# Patient Record
Sex: Female | Born: 1940
Health system: Southern US, Community
[De-identification: ages and names within clinical notes are randomized; demographics above are authoritative.]

## PROBLEM LIST (undated history)

## (undated) DIAGNOSIS — R011 Cardiac murmur, unspecified: Secondary | ICD-10-CM

## (undated) DIAGNOSIS — K635 Polyp of colon: Secondary | ICD-10-CM

## (undated) DIAGNOSIS — J449 Chronic obstructive pulmonary disease, unspecified: Secondary | ICD-10-CM

## (undated) DIAGNOSIS — D649 Anemia, unspecified: Secondary | ICD-10-CM

## (undated) DIAGNOSIS — C801 Malignant (primary) neoplasm, unspecified: Secondary | ICD-10-CM

## (undated) DIAGNOSIS — K219 Gastro-esophageal reflux disease without esophagitis: Secondary | ICD-10-CM

## (undated) DIAGNOSIS — I499 Cardiac arrhythmia, unspecified: Secondary | ICD-10-CM

## (undated) DIAGNOSIS — R0600 Dyspnea, unspecified: Secondary | ICD-10-CM

## (undated) DIAGNOSIS — N3090 Cystitis, unspecified without hematuria: Secondary | ICD-10-CM

## (undated) DIAGNOSIS — E785 Hyperlipidemia, unspecified: Secondary | ICD-10-CM

## (undated) DIAGNOSIS — J189 Pneumonia, unspecified organism: Secondary | ICD-10-CM

## (undated) HISTORY — DX: Malignant (primary) neoplasm, unspecified: C80.1

## (undated) HISTORY — DX: Hyperlipidemia, unspecified: E78.5

## (undated) HISTORY — PX: CATARACT EXTRACTION, BILATERAL: SHX1313

## (undated) HISTORY — DX: Cystitis, unspecified without hematuria: N30.90

## (undated) HISTORY — DX: Polyp of colon: K63.5

## (undated) HISTORY — DX: Gastro-esophageal reflux disease without esophagitis: K21.9

## (undated) HISTORY — PX: ABDOMINAL HYSTERECTOMY: SHX81

---

## 2003-08-10 ENCOUNTER — Other Ambulatory Visit: Payer: Self-pay

## 2005-01-07 ENCOUNTER — Ambulatory Visit: Payer: Self-pay | Admitting: General Practice

## 2005-01-28 ENCOUNTER — Ambulatory Visit: Payer: Self-pay | Admitting: Internal Medicine

## 2005-03-26 ENCOUNTER — Ambulatory Visit: Payer: Self-pay | Admitting: Endocrinology

## 2005-05-22 ENCOUNTER — Ambulatory Visit: Payer: Self-pay | Admitting: Gastroenterology

## 2005-09-04 ENCOUNTER — Ambulatory Visit: Payer: Self-pay | Admitting: Gastroenterology

## 2006-02-09 ENCOUNTER — Ambulatory Visit: Payer: Self-pay | Admitting: General Practice

## 2008-10-08 ENCOUNTER — Ambulatory Visit: Payer: Self-pay | Admitting: Internal Medicine

## 2008-10-15 ENCOUNTER — Ambulatory Visit: Payer: Self-pay | Admitting: Internal Medicine

## 2009-10-10 ENCOUNTER — Ambulatory Visit: Payer: Self-pay | Admitting: Internal Medicine

## 2009-10-11 ENCOUNTER — Ambulatory Visit: Payer: Self-pay | Admitting: Internal Medicine

## 2010-04-14 ENCOUNTER — Ambulatory Visit: Payer: Self-pay | Admitting: Surgery

## 2010-08-17 DIAGNOSIS — K635 Polyp of colon: Secondary | ICD-10-CM

## 2010-08-17 HISTORY — PX: COLONOSCOPY: SHX174

## 2010-08-17 HISTORY — DX: Polyp of colon: K63.5

## 2010-09-08 ENCOUNTER — Ambulatory Visit: Payer: Self-pay | Admitting: Gastroenterology

## 2010-09-11 LAB — PATHOLOGY REPORT

## 2010-10-23 ENCOUNTER — Ambulatory Visit: Payer: Self-pay | Admitting: Internal Medicine

## 2011-04-17 ENCOUNTER — Emergency Department: Payer: Self-pay | Admitting: Emergency Medicine

## 2011-05-07 ENCOUNTER — Ambulatory Visit: Payer: Self-pay | Admitting: Internal Medicine

## 2011-06-28 ENCOUNTER — Emergency Department: Payer: Self-pay | Admitting: Emergency Medicine

## 2011-07-01 ENCOUNTER — Ambulatory Visit: Payer: Self-pay | Admitting: Specialist

## 2011-07-03 ENCOUNTER — Ambulatory Visit: Payer: Self-pay | Admitting: Specialist

## 2011-08-18 DIAGNOSIS — N3090 Cystitis, unspecified without hematuria: Secondary | ICD-10-CM

## 2011-08-18 HISTORY — PX: FRACTURE SURGERY: SHX138

## 2011-08-18 HISTORY — DX: Cystitis, unspecified without hematuria: N30.90

## 2011-11-27 ENCOUNTER — Ambulatory Visit: Payer: Self-pay | Admitting: Internal Medicine

## 2011-12-03 ENCOUNTER — Ambulatory Visit: Payer: Self-pay | Admitting: Internal Medicine

## 2012-06-16 ENCOUNTER — Ambulatory Visit: Payer: Self-pay | Admitting: Internal Medicine

## 2013-01-05 ENCOUNTER — Ambulatory Visit: Payer: Self-pay | Admitting: Internal Medicine

## 2013-07-25 ENCOUNTER — Ambulatory Visit: Payer: Self-pay | Admitting: Internal Medicine

## 2013-07-31 ENCOUNTER — Other Ambulatory Visit: Payer: Medicare Other

## 2013-07-31 ENCOUNTER — Ambulatory Visit (INDEPENDENT_AMBULATORY_CARE_PROVIDER_SITE_OTHER): Payer: Medicare Other | Admitting: General Surgery

## 2013-07-31 ENCOUNTER — Encounter: Payer: Self-pay | Admitting: General Surgery

## 2013-07-31 VITALS — BP 140/76 | Ht 65.0 in | Wt 203.0 lb

## 2013-07-31 DIAGNOSIS — R928 Other abnormal and inconclusive findings on diagnostic imaging of breast: Secondary | ICD-10-CM

## 2013-07-31 DIAGNOSIS — N63 Unspecified lump in unspecified breast: Secondary | ICD-10-CM

## 2013-07-31 HISTORY — PX: BREAST SURGERY: SHX581

## 2013-07-31 NOTE — Progress Notes (Signed)
Patient ID: Renee Meyer, female   DOB: 04/13/1941, 72 y.o.   MRN: [id number]  Chief Complaint  Patient presents with  . Follow-up    mammogram    HPI Renee Meyer is a 72 y.o. female who presents for a breast evaluation. The most recent mammogram and right breast ultrasound was done on 07/25/13 Patient does perform regular self breast checks and gets regular mammograms done. Patient states in 2013 she had a right breast aspiration .    The patient underwent ultrasound and cyst aspiration in December 2013. HPI  Past Medical History  Diagnosis Date  . Colon polyps 2012  . Cancer in her 80s    basal cell carcinoma  . Cystitis 2013  . Hyperlipidemia   . GERD (gastroesophageal reflux disease)     Past Surgical History  Procedure Laterality Date  . Colonoscopy  2012    Dr Mechele Collin  . Abdominal hysterectomy    . Cesarean section      Family History  Problem Relation Age of Onset  . Colon cancer Sister     Social History History  Substance Use Topics  . Smoking status: Former Games developer  . Smokeless tobacco: Never Used  . Alcohol Use: No    Allergies  Allergen Reactions  . Amoxicillin Hives  . Bisoprolol Hives  . Clindamycin/Lincomycin Hives  . Codeine     headache  . Imdur [Isosorbide Dinitrate]   . Penicillins Hives  . Sulfa Antibiotics Hives  . Zocor [Simvastatin] Swelling    Current Outpatient Prescriptions  Medication Sig Dispense Refill  . ADVAIR DISKUS 250-50 MCG/DOSE AEPB       . DEXILANT 60 MG capsule Take 1 capsule by mouth daily.      Marland Kitchen galantamine (RAZADYNE) 4 MG tablet Take 1 tablet by mouth daily.      . meclizine (ANTIVERT) 25 MG tablet Take 1 tablet by mouth daily.      . metoprolol succinate (TOPROL-XL) 50 MG 24 hr tablet Take 1 tablet by mouth daily.      . pravastatin (PRAVACHOL) 40 MG tablet Take 1 tablet by mouth daily.       No current facility-administered medications for this visit.    Review of Systems Review of Systems   Constitutional: Negative.   Respiratory: Negative.   Cardiovascular: Negative.     Blood pressure 140/76, height 5\' 5"  (1.651 m), weight 203 lb (92.08 kg).  Physical Exam Physical Exam  Constitutional: She is oriented to person, place, and time. She appears well-developed and well-nourished.  Eyes: No scleral icterus.  Cardiovascular: Normal rate, regular rhythm and normal heart sounds.   Pulmonary/Chest: Breath sounds normal. Right breast exhibits no inverted nipple, no mass, no nipple discharge, no skin change and no tenderness. Left breast exhibits no inverted nipple, no mass, no nipple discharge, no skin change and no tenderness.  Abdominal: Soft. Normal appearance and bowel sounds are normal. There is no hepatomegaly. There is no tenderness. No hernia.  Lymphadenopathy:    She has no cervical adenopathy.    She has no axillary adenopathy.  Neurological: She is alert and oriented to person, place, and time.  Skin: Skin is warm and dry.    Data Reviewed Right breast mammogram and ultrasound dated 07/25/2013 showed a stable focal asymmetry in the right breast unchanged from 2014, slightly more prominent compared the past exams.  Ultrasound showed multiple small cysts measuring up to 9 mm. There is felt to correspond to the mammographic abnormality.  Ultrasound examination  of the right breast in the 9:00 position 5 cm from the nipple showed findings essentially unchanged from her 07/21/2012 exam. Multiple small hypoechoic nodules were identified and an area measuring 1.35 x 1.35 x 1.3 cm. No significant vascular flow was identified. He was in the same year that cyst aspiration was completed last December.  The patient was amenable to a vacuum biopsy. 10 cc of 0.5% Xylocaine with 0.25% Marcaine was utilized and well tolerated. A 10-gauge Encor device was used and approximately 10 samples were obtained. All of the obvious cystic lesions were encompassed in the biopsy. Less than 2 cc of  bleeding were noted. This was controlled with direct pressure. A postbiopsy clip was placed. Skin defect was closed with benzoin and Steri-Strips followed by Telfa and Tegaderm dressing. The patient tolerated procedure well postbiopsy instructions were provided.  Assessment    Stable density right breast, ongoing radiology concern.    Plan    The patient will be contacted when her biopsy results are returned. Postbiopsy instructions have been provided. Assuming benign results, she will follow up with the nurse for wound evaluation in one week.       Earline Mayotte 08/01/2013, 1:39 PM

## 2013-07-31 NOTE — Patient Instructions (Signed)

## 2013-08-01 DIAGNOSIS — R928 Other abnormal and inconclusive findings on diagnostic imaging of breast: Secondary | ICD-10-CM | POA: Insufficient documentation

## 2013-08-02 LAB — PATHOLOGY

## 2013-08-03 ENCOUNTER — Telehealth: Payer: Self-pay | Admitting: General Surgery

## 2013-08-03 ENCOUNTER — Telehealth: Payer: Self-pay | Admitting: *Deleted

## 2013-08-03 NOTE — Telephone Encounter (Signed)
The patient returned by called yesterday to get her pathology report. She was advised only benign findings were identified. Multiple tiny cyst and dilated duct. She reports minimal discomfort since the procedure was completed.  She will return next week for wound check with the nurse and arrangements were made for a followup mammogram of the stent 6 months to establish a new baseline.

## 2013-08-03 NOTE — Telephone Encounter (Signed)
Notified patient as instructed, patient pleased. Discussed follow-up appointments, patient agrees  

## 2013-08-03 NOTE — Telephone Encounter (Signed)
Message copied by Currie Paris on Thu Aug 03, 2013  9:43 AM ------      Message from: [place], [place] W      Created: Thu Aug 03, 2013  7:34 AM       I have tried to isolate the patient knows that her biopsy completed on Monday was benign. Please give her another try. ------

## 2013-08-07 ENCOUNTER — Ambulatory Visit (INDEPENDENT_AMBULATORY_CARE_PROVIDER_SITE_OTHER): Payer: Medicare Other | Admitting: *Deleted

## 2013-08-07 DIAGNOSIS — N63 Unspecified lump in unspecified breast: Secondary | ICD-10-CM

## 2013-08-07 NOTE — Progress Notes (Signed)
Patient here today for follow up post right  breast biopsy.  Dressing removed, steristrip in place and aware it may come off in one week.  Minimal bruising noted.  The patient is aware that a heating pad may be used for comfort as needed.  Aware of pathology. Follow up as scheduled. 

## 2014-02-12 ENCOUNTER — Encounter: Payer: Self-pay | Admitting: General Surgery

## 2014-02-19 ENCOUNTER — Encounter: Payer: Self-pay | Admitting: General Surgery

## 2014-02-19 ENCOUNTER — Ambulatory Visit (INDEPENDENT_AMBULATORY_CARE_PROVIDER_SITE_OTHER): Payer: Medicare Other | Admitting: General Surgery

## 2014-02-19 ENCOUNTER — Ambulatory Visit: Payer: Medicare Other | Admitting: General Surgery

## 2014-02-19 VITALS — BP 140/78 | HR 76 | Resp 14 | Ht 66.0 in | Wt 219.0 lb

## 2014-02-19 DIAGNOSIS — R928 Other abnormal and inconclusive findings on diagnostic imaging of breast: Secondary | ICD-10-CM

## 2014-02-19 NOTE — Progress Notes (Signed)
Patient ID: Renee Meyer, female   DOB: July 14, 1941, 73 y.o.   MRN: [id number]  Chief Complaint  Patient presents with  . Follow-up    mammogram    HPI [name] is a 73 y.o. female who presents for a breast evaluation. The most recent mammogram was done on 02/09/14 Patient does perform regular self breast checks and gets regular mammograms done.    HPI  Past Medical History  Diagnosis Date  . Colon polyps 2012  . Cancer in her 73s    basal cell carcinoma  . Cystitis 2013  . Hyperlipidemia   . GERD (gastroesophageal reflux disease)     Past Surgical History  Procedure Laterality Date  . Colonoscopy  2012    Dr Vira Agar  . Abdominal hysterectomy    . Cesarean section    . 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    Family History  Problem Relation Age of Onset  . Colon cancer Sister     Social History History  Substance Use Topics  . Smoking status: Former Research scientist (life sciences)  . Smokeless tobacco: Never Used  . Alcohol Use: No    Allergies  Allergen Reactions  . Amoxicillin Hives  . Bisoprolol Hives  . Clindamycin/Lincomycin Hives  . Codeine     headache  . Imdur [Isosorbide Dinitrate]   . Penicillins Hives  . Sulfa Antibiotics Hives  . Zocor [Simvastatin] Swelling    Current Outpatient Prescriptions  Medication Sig Dispense Refill  . ADVAIR DISKUS 250-50 MCG/DOSE AEPB       . DEXILANT 60 MG capsule Take 1 capsule by mouth daily.      Marland Kitchen galantamine (RAZADYNE) 4 MG tablet Take 1 tablet by mouth daily.      . meclizine (ANTIVERT) 25 MG tablet Take 1 tablet by mouth daily.      . metoprolol succinate (TOPROL-XL) 50 MG 24 hr tablet Take 1 tablet by mouth daily.      . pravastatin (PRAVACHOL) 40 MG tablet Take 1 tablet by mouth daily.       No current facility-administered medications for this visit.    Review of Systems Review of Systems  Constitutional: Negative.   Respiratory: Negative.   Cardiovascular: Positive  for leg swelling. Negative for chest pain and palpitations.    Blood pressure 140/78, pulse 76, resp. rate 14, height 5\' 6"  (1.676 m), weight 219 lb (99.338 kg).  Physical Exam Physical Exam  Constitutional: She is oriented to person, place, and time. She appears well-developed and well-nourished.  Eyes: Conjunctivae are normal. No scleral icterus.  Neck: Neck supple.  Cardiovascular: Normal rate and regular rhythm.   Murmur heard.  Systolic murmur is present with a grade of 1/6  Pulmonary/Chest: Effort normal and breath sounds normal. Right breast exhibits no inverted nipple, no mass, no nipple discharge, no skin change and no tenderness. Left breast exhibits no inverted nipple, no mass, no nipple discharge, no skin change and no tenderness.  Right breast well healed biopsy site.  Abdominal: Soft. Bowel sounds are normal. There is no tenderness.  Musculoskeletal:       Legs: Lymphadenopathy:    She has no cervical adenopathy.    She has no axillary adenopathy.  Neurological: She is alert and oriented to person, place, and time.  Skin: Skin is warm and dry.    Data Reviewed Bilateral mammograms dated February 09, 2014 completed [facility] were reviewed and compared to pre-biopsy studies.  Previous area of architectural distortion of the right breast has been removed. BI-RAD-2.  Assessment    Benign breast exam. Stable mammograms.  Increasing swelling in the gaiter distribution of the lower extremities bilaterally.  Stable cardiopulmonary exam.  Weight gain.      Plan    There is no evidence of congestive failure on today's exam. The bilateral lower extremity edema may be secondary to venous incompetence. She was encouraged to contact her PCP for reassessment of her cardiopulmonary status.      PCP: Fanny Skates 02/19/2014, 10:08 PM

## 2014-02-19 NOTE — Patient Instructions (Signed)
Patient to return as needed. 

## 2014-04-26 ENCOUNTER — Emergency Department: Payer: Self-pay | Admitting: Emergency Medicine

## 2014-04-26 LAB — BASIC METABOLIC PANEL
Anion Gap: 5 — ABNORMAL LOW (ref 7–16)
BUN: 19 mg/dL — AB (ref 7–18)
CO2: 27 mmol/L (ref 21–32)
Calcium, Total: 9.1 mg/dL (ref 8.5–10.1)
Chloride: 110 mmol/L — ABNORMAL HIGH (ref 98–107)
Creatinine: 0.89 mg/dL (ref 0.60–1.30)
EGFR (Non-African Amer.): >60
Glucose: 96 mg/dL (ref 65–99)
Osmolality: 285 (ref 275–301)
POTASSIUM: 4.1 mmol/L (ref 3.5–5.1)
Sodium: 142 mmol/L (ref 136–145)

## 2014-04-26 LAB — D-DIMER(ARMC): D-DIMER: 749 ng/mL

## 2014-04-26 LAB — PRO B NATRIURETIC PEPTIDE: B-Type Natriuretic Peptide: 79 pg/mL (ref 0–125)

## 2014-04-26 LAB — CBC
HCT: 37.3 % (ref 35.0–47.0)
HGB: 12 g/dL (ref 12.0–16.0)
MCH: 29.8 pg (ref 26.0–34.0)
MCHC: 32.1 g/dL (ref 32.0–36.0)
MCV: 93 fL (ref 80–100)
Platelet: 214 10*3/uL (ref 150–440)
RBC: 4.02 10*6/uL (ref 3.80–5.20)
RDW: 13.5 % (ref 11.5–14.5)
WBC: 7.9 10*3/uL (ref 3.6–11.0)

## 2014-04-26 LAB — TSH: Thyroid Stimulating Horm: 1.36 u[IU]/mL

## 2014-04-26 LAB — TROPONIN I: Troponin-I: <0.02 ng/mL

## 2014-05-22 DIAGNOSIS — K219 Gastro-esophageal reflux disease without esophagitis: Secondary | ICD-10-CM | POA: Insufficient documentation

## 2014-05-22 DIAGNOSIS — E782 Mixed hyperlipidemia: Secondary | ICD-10-CM | POA: Insufficient documentation

## 2014-11-26 DIAGNOSIS — I493 Ventricular premature depolarization: Secondary | ICD-10-CM | POA: Insufficient documentation

## 2014-12-31 ENCOUNTER — Other Ambulatory Visit: Payer: Self-pay | Admitting: Orthopedic Surgery

## 2014-12-31 DIAGNOSIS — M65831 Other synovitis and tenosynovitis, right forearm: Secondary | ICD-10-CM

## 2014-12-31 DIAGNOSIS — M1811 Unilateral primary osteoarthritis of first carpometacarpal joint, right hand: Secondary | ICD-10-CM

## 2014-12-31 DIAGNOSIS — M25531 Pain in right wrist: Secondary | ICD-10-CM

## 2015-01-04 ENCOUNTER — Ambulatory Visit
Admission: RE | Admit: 2015-01-04 | Discharge: 2015-01-04 | Disposition: A | Discharge status: Home / Self Care | Payer: Medicare Other | Source: Ambulatory Visit | Attending: Orthopedic Surgery | Admitting: Orthopedic Surgery

## 2015-01-04 DIAGNOSIS — S62114A Nondisplaced fracture of triquetrum [cuneiform] bone, right wrist, initial encounter for closed fracture: Secondary | ICD-10-CM | POA: Diagnosis not present

## 2015-01-04 DIAGNOSIS — M79641 Pain in right hand: Secondary | ICD-10-CM | POA: Diagnosis present

## 2015-01-04 DIAGNOSIS — M65831 Other synovitis and tenosynovitis, right forearm: Secondary | ICD-10-CM

## 2015-01-04 DIAGNOSIS — R609 Edema, unspecified: Secondary | ICD-10-CM | POA: Insufficient documentation

## 2015-01-04 DIAGNOSIS — M79644 Pain in right finger(s): Secondary | ICD-10-CM | POA: Diagnosis present

## 2015-01-04 DIAGNOSIS — S63591A Other specified sprain of right wrist, initial encounter: Secondary | ICD-10-CM | POA: Insufficient documentation

## 2015-01-04 DIAGNOSIS — M1811 Unilateral primary osteoarthritis of first carpometacarpal joint, right hand: Secondary | ICD-10-CM

## 2015-01-04 DIAGNOSIS — M25531 Pain in right wrist: Secondary | ICD-10-CM

## 2015-09-26 DIAGNOSIS — R300 Dysuria: Secondary | ICD-10-CM | POA: Diagnosis not present

## 2015-11-19 DIAGNOSIS — J40 Bronchitis, not specified as acute or chronic: Secondary | ICD-10-CM | POA: Diagnosis not present

## 2015-11-19 DIAGNOSIS — J4522 Mild intermittent asthma with status asthmaticus: Secondary | ICD-10-CM | POA: Diagnosis not present

## 2015-11-28 DIAGNOSIS — Z79899 Other long term (current) drug therapy: Secondary | ICD-10-CM | POA: Diagnosis not present

## 2015-11-28 DIAGNOSIS — E782 Mixed hyperlipidemia: Secondary | ICD-10-CM | POA: Diagnosis not present

## 2015-12-05 DIAGNOSIS — J449 Chronic obstructive pulmonary disease, unspecified: Secondary | ICD-10-CM | POA: Insufficient documentation

## 2015-12-05 DIAGNOSIS — Z0000 Encounter for general adult medical examination without abnormal findings: Secondary | ICD-10-CM | POA: Diagnosis not present

## 2015-12-05 DIAGNOSIS — E782 Mixed hyperlipidemia: Secondary | ICD-10-CM | POA: Diagnosis not present

## 2016-01-13 DIAGNOSIS — J069 Acute upper respiratory infection, unspecified: Secondary | ICD-10-CM | POA: Diagnosis not present

## 2016-02-27 DIAGNOSIS — Z1231 Encounter for screening mammogram for malignant neoplasm of breast: Secondary | ICD-10-CM | POA: Diagnosis not present

## 2016-03-17 DIAGNOSIS — K5792 Diverticulitis of intestine, part unspecified, without perforation or abscess without bleeding: Secondary | ICD-10-CM | POA: Diagnosis not present

## 2016-05-18 DIAGNOSIS — R300 Dysuria: Secondary | ICD-10-CM | POA: Diagnosis not present

## 2016-05-19 DIAGNOSIS — J209 Acute bronchitis, unspecified: Secondary | ICD-10-CM | POA: Diagnosis not present

## 2016-06-03 DIAGNOSIS — Z0000 Encounter for general adult medical examination without abnormal findings: Secondary | ICD-10-CM | POA: Diagnosis not present

## 2016-06-03 DIAGNOSIS — E782 Mixed hyperlipidemia: Secondary | ICD-10-CM | POA: Diagnosis not present

## 2016-06-08 DIAGNOSIS — Z79899 Other long term (current) drug therapy: Secondary | ICD-10-CM | POA: Diagnosis not present

## 2016-06-08 DIAGNOSIS — J449 Chronic obstructive pulmonary disease, unspecified: Secondary | ICD-10-CM | POA: Diagnosis not present

## 2016-07-26 ENCOUNTER — Emergency Department: Payer: PPO

## 2016-07-26 ENCOUNTER — Encounter: Payer: Self-pay | Admitting: Medical Oncology

## 2016-07-26 ENCOUNTER — Emergency Department
Admission: EM | Admit: 2016-07-26 | Discharge: 2016-07-26 | Disposition: A | Discharge status: Home / Self Care | Payer: PPO | Attending: Emergency Medicine | Admitting: Emergency Medicine

## 2016-07-26 DIAGNOSIS — S82851A Displaced trimalleolar fracture of right lower leg, initial encounter for closed fracture: Secondary | ICD-10-CM | POA: Diagnosis not present

## 2016-07-26 DIAGNOSIS — Z87891 Personal history of nicotine dependence: Secondary | ICD-10-CM | POA: Diagnosis not present

## 2016-07-26 DIAGNOSIS — Y999 Unspecified external cause status: Secondary | ICD-10-CM | POA: Insufficient documentation

## 2016-07-26 DIAGNOSIS — Y929 Unspecified place or not applicable: Secondary | ICD-10-CM | POA: Diagnosis not present

## 2016-07-26 DIAGNOSIS — X501XXA Overexertion from prolonged static or awkward postures, initial encounter: Secondary | ICD-10-CM | POA: Insufficient documentation

## 2016-07-26 DIAGNOSIS — Y939 Activity, unspecified: Secondary | ICD-10-CM | POA: Insufficient documentation

## 2016-07-26 DIAGNOSIS — S99911A Unspecified injury of right ankle, initial encounter: Secondary | ICD-10-CM | POA: Diagnosis not present

## 2016-07-26 MED ORDER — OXYCODONE-ACETAMINOPHEN 5-325 MG PO TABS: 1.0000 | ORAL_TABLET | Freq: Four times a day (QID) | ORAL | 0 refills | Status: DC | PRN

## 2016-07-26 MED ORDER — OXYCODONE-ACETAMINOPHEN 5-325 MG PO TABS
1.0000 | ORAL_TABLET | Freq: Once | ORAL | Status: AC
Start: 2016-07-26 — End: 2016-07-26
  Administered 2016-07-26: 1 via ORAL
  Filled 2016-07-26: qty 1

## 2016-07-26 NOTE — ED Triage Notes (Signed)
Pt reports she slipped and fell this am on ice and injured her rt ankle. Pt denies hitting head.

## 2016-07-26 NOTE — ED Provider Notes (Signed)
[facility] Emergency Department Provider Note  ____________________________________________  Time seen: Approximately 3:35 PM  I have reviewed the triage vital signs and the nursing notes.   HISTORY  Chief Complaint Ankle Pain    HPI Renee Meyer is a 75 y.o. female , NAD, presents to the mesh department with right ankle pain and swelling. Patient states she slipped on a wet ramp earlier today and states that her right ankle twisted and went beneath her. Had immediate onset of pain and swelling about the right ankle. Was able to bear weight but with significant pain. Denies head injury, neck pain, back pain. No LOC, visual changes, lightheadedness or dizziness. Has had no pelvic or hip pain. Denies any numbness, weakness, tingling. Does state she has chronic bilateral lower extremity edema in which she takes Lasix. Denies any previous injuries or traumas to the ankle. Denies chest pain, shortness breath, abdominal pain, nausea or vomiting. Does note a superficial abrasion to the medial right ankle but otherwise no open wounds or active bleeding, oozing or weeping.   Past Medical History:  Diagnosis Date  . Cancer ([place]) in her 67s   basal cell carcinoma  . Colon polyps 2012  . Cystitis 2013  . GERD (gastroesophageal reflux disease)   . Hyperlipidemia     Patient Active Problem List   Diagnosis Date Noted  . Abnormal mammogram 08/01/2013    Past Surgical History:  Procedure Laterality Date  . ABDOMINAL HYSTERECTOMY    . 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  Marland Kitchen CESAREAN SECTION    . COLONOSCOPY  2012   Dr Vira Agar    Prior to Admission medications   Medication Sig Start Date End Date Taking? Authorizing Provider  ADVAIR DISKUS 250-50 MCG/DOSE AEPB  06/13/13   Historical Provider, MD  DEXILANT 60 MG capsule Take 1 capsule by mouth daily. 07/14/13   Historical Provider, MD  galantamine (RAZADYNE) 4 MG  tablet Take 1 tablet by mouth daily. 07/14/13   Historical Provider, MD  meclizine (ANTIVERT) 25 MG tablet Take 1 tablet by mouth daily. 07/17/13   Historical Provider, MD  metoprolol succinate (TOPROL-XL) 50 MG 24 hr tablet Take 1 tablet by mouth daily. 07/14/13   Historical Provider, MD  oxyCODONE-acetaminophen (ROXICET) 5-325 MG tablet Take 1 tablet by mouth every 6 (six) hours as needed. 07/26/16 07/26/17  Earlena Werst L Georgios Kina, PA-C  pravastatin (PRAVACHOL) 40 MG tablet Take 1 tablet by mouth daily. 05/15/13   Historical Provider, MD    Allergies Amoxicillin; Bisoprolol; Clindamycin/lincomycin; Codeine; Imdur [isosorbide dinitrate]; Penicillins; Sulfa antibiotics; and Zocor [simvastatin]  Family History  Problem Relation Age of Onset  . Colon cancer Sister     Social History Social History  Substance Use Topics  . Smoking status: Former Research scientist (life sciences)  . Smokeless tobacco: Never Used  . Alcohol use No     Review of Systems  Constitutional: No fatigue Eyes: No visual changes.  Cardiovascular: No chest pain. Respiratory: No shortness of breath.  Gastrointestinal: No abdominal pain.  No nausea, vomiting.   Musculoskeletal: Positive right ankle pain. Negative for back, Neck, pelvic, hip pain.  Skin: Positive swelling right ankle. Positive superficial abrasion right ankle. Negative for rash or pruritus, abnormal warmth, bruising, lacerations. Neurological: Negative for numbness, weakness, tingling. No LOC, dizziness, lightheadedness. 10-point ROS otherwise negative.  ____________________________________________   PHYSICAL EXAM:  VITAL SIGNS: ED Triage Vitals [07/26/16 1436]  Enc Vitals Group     BP (!) 171/84  Pulse Rate 81     Resp 18     Temp 97.5 F (36.4 C)     Temp Source Oral     SpO2 97 %     Weight 200 lb (90.7 kg)     Height 5\' 5"  (1.651 m)     Head Circumference      [facility] Flow      Pain Score 10     Pain Loc      Pain Edu?      Excl. in [place]?      Constitutional:  Alert and oriented. Well appearing and in no acute distress. Eyes: Conjunctivae are normal without icterus, injection or discharge Head: Atraumatic. Neck: Supple with full range of motion. Cardiovascular:  Good peripheral circulation with palpable pulses in the right lower extremity but difficult to assess due to chronic edema and pain. Capillary refill is brisk in all digits of the right foot. Respiratory: Normal respiratory effort without tachypnea or retractions.  Musculoskeletal: Tenderness to palpation about the medial and lateral malleolus of the right ankle. Decreased range of motion of the right ankle due to pain and swelling. Full range of motion of all digits on the right foot. Bilateral lower extremity edema with trace pitting in which the patient states is chronic and unchanged.  No joint effusions. Neurologic:  Normal speech and language. No gross focal neurologic deficits are appreciated. Sensation to light touch grossly intact. The right lower extremity. Skin:  Superficial, nonbleeding abrasion noted about the right medial ankle. Skin is warm, dry. No rash noted. Psychiatric: Mood and affect are normal. Speech and behavior are normal. Patient exhibits appropriate insight and judgement.   ____________________________________________   LABS  None ____________________________________________  EKG  None ____________________________________________  RADIOLOGY I, Braxton Feathers, personally viewed and evaluated these images (plain radiographs) as part of my medical decision making, as well as reviewing the written report by the radiologist.  Dg Ankle Complete Right  Result Date: 07/26/2016 CLINICAL DATA:  Fall this morning with right ankle injury EXAM: RIGHT ANKLE - COMPLETE 3+ VIEW COMPARISON:  None. FINDINGS: Marked diffuse right ankle soft tissue swelling. Oblique right lateral malleolus fracture with minimal 3 mm posterior/lateral displacement of the dominant distal fracture  fragment. Transverse nondisplaced right medial malleolus fracture. Posterior malleolus fracture in the right distal tibia without significant displacement. No subluxation at the right ankle mortise. No suspicious focal osseous lesion. No radiopaque foreign body. IMPRESSION: Minimally displaced trimalleolar right ankle fracture as described. No subluxation. Marked diffuse soft tissue swelling. Electronically Signed   By: Ilona Sorrel M.D.   On: 07/26/2016 15:31    ____________________________________________    PROCEDURES  Procedure(s) performed: None   Procedures   Medications  oxyCODONE-acetaminophen (PERCOCET/ROXICET) 5-325 MG per tablet 1 tablet (1 tablet Oral Given 07/26/16 1601)     ____________________________________________   INITIAL IMPRESSION / ASSESSMENT AND PLAN / ED COURSE  Pertinent labs & imaging results that were available during my care of the patient were reviewed by me and considered in my medical decision making (see chart for details).  Clinical Course as of Jul 27 1643  Sun Jul 26, 2016  1606 I spoke with Dr. Mack Guise, on call physician for orthopedics, in regards to the patient's history, physical exam and imaging results. He suggested the patient be placed in a splint and should be completely nonweightbearing. She should call Dr. Monter Mo office tomorrow morning to schedule an appointment for follow-up.   [JH]    Clinical Course  User Index [JH] Braxton Feathers, PA-C    Patient's diagnosis is consistent with closed trimalleolar fracture of the right ankle. Patient was placed in a short leg stirrup splint and given a walker to use only for transfers from sitting to standing, from the bed to the restroom. Patient verbalizes understanding that she is not to bear weight on the right ankle or foot.  Patient will be discharged home with prescriptions for Roxicet to take as needed, sparingly for severe pain. Patient is to follow up with Dr. Mack Guise in  orthopedics for further evaluation and treatment of fracture. Patient will call Dr. Emrich Mo office tomorrow morning to schedule her appointment. Patient is given ED precautions to return to the ED for any worsening or new symptoms.    ____________________________________________  FINAL CLINICAL IMPRESSION(S) / ED DIAGNOSES  Final diagnoses:  Closed trimalleolar fracture of right ankle, initial encounter      NEW MEDICATIONS STARTED DURING THIS VISIT:  New Prescriptions   OXYCODONE-ACETAMINOPHEN (ROXICET) 5-325 MG TABLET    Take 1 tablet by mouth every 6 (six) hours as needed.         Braxton Feathers, PA-C 07/26/16 1646    Earleen Newport, MD 07/26/16 [phone]

## 2016-07-26 NOTE — Discharge Instructions (Signed)
DO NOT WALK OR WEIGHT BEAR ON THE RIGHT FOOT/ANKLE.  KEEP RIGHT ANKLE ELEVATED TO DECREASE SWELLING

## 2016-07-29 DIAGNOSIS — I10 Essential (primary) hypertension: Secondary | ICD-10-CM | POA: Diagnosis not present

## 2016-07-29 DIAGNOSIS — S82855A Nondisplaced trimalleolar fracture of left lower leg, initial encounter for closed fracture: Secondary | ICD-10-CM | POA: Diagnosis not present

## 2016-07-29 DIAGNOSIS — S82899A Other fracture of unspecified lower leg, initial encounter for closed fracture: Secondary | ICD-10-CM | POA: Diagnosis present

## 2016-07-29 DIAGNOSIS — R262 Difficulty in walking, not elsewhere classified: Secondary | ICD-10-CM | POA: Diagnosis not present

## 2016-07-29 DIAGNOSIS — M6281 Muscle weakness (generalized): Secondary | ICD-10-CM | POA: Diagnosis not present

## 2016-07-29 DIAGNOSIS — Z7401 Bed confinement status: Secondary | ICD-10-CM | POA: Diagnosis not present

## 2016-07-29 DIAGNOSIS — Z5189 Encounter for other specified aftercare: Secondary | ICD-10-CM | POA: Diagnosis not present

## 2016-07-29 DIAGNOSIS — K219 Gastro-esophageal reflux disease without esophagitis: Secondary | ICD-10-CM | POA: Diagnosis not present

## 2016-07-29 DIAGNOSIS — E785 Hyperlipidemia, unspecified: Secondary | ICD-10-CM | POA: Diagnosis not present

## 2016-07-29 DIAGNOSIS — M25571 Pain in right ankle and joints of right foot: Secondary | ICD-10-CM | POA: Diagnosis not present

## 2016-07-29 NOTE — NC FL2 (Signed)
  [place] LEVEL OF CARE SCREENING TOOL     IDENTIFICATION  Patient Name: Renee Meyer Birthdate: 01-Sep-1940 Sex: female Admission Date (Current Location): 07/26/2016  [place] and [place] Number:  Engineering geologist and Address:  [facility], [address] [place], [place] [address]      Provider Number: [id number]  Attending Physician Name and Address:  No att. providers found  Relative Name and Phone Number:       Current Level of Care: Hospital Recommended Level of Care: [place] Prior Approval Number:    Date Approved/Denied:   PASRR Number:  ([id number])  Discharge Plan: SNF    Current Diagnoses: Patient Active Problem List   Diagnosis Date Noted  . Abnormal mammogram 08/01/2013    Orientation RESPIRATION BLADDER Height & Weight     Self, Time, Situation, Place  Normal Continent Weight: 200 lb (90.7 kg) Height:  5\' 5"  (165.1 cm)  BEHAVIORAL SYMPTOMS/MOOD NEUROLOGICAL BOWEL NUTRITION STATUS   (none)  (none) Continent Diet (Regular Diet )  AMBULATORY STATUS COMMUNICATION OF NEEDS Skin   Extensive Assist Verbally Normal                       Personal Care Assistance Level of Assistance  Bathing, Feeding, Dressing Bathing Assistance: Limited assistance Feeding assistance: Independent Dressing Assistance: Limited assistance     Functional Limitations Info  Sight, Hearing, Speech Sight Info: Adequate Hearing Info: Adequate Speech Info: Adequate    SPECIAL CARE FACTORS FREQUENCY  PT (By licensed PT), OT (By licensed OT)     PT Frequency:  (5) OT Frequency:  (5)            Contractures      Additional Factors Info  Code Status, Allergies Code Status Info:  (Full Code. ) Allergies Info:  (Amoxicillin, Bisoprolol, Clindamycin/lincomycin, Codeine, ImdurI sosorbide Dinitrate, Penicillins, Sulfa Antibiotics, Zocor Simvastatin )           Current Medications (07/29/2016):  This  is the current hospital active medication list No current facility-administered medications for this encounter.    Current Outpatient Prescriptions  Medication Sig Dispense Refill  . ADVAIR DISKUS 250-50 MCG/DOSE AEPB     . DEXILANT 60 MG capsule Take 1 capsule by mouth daily.    Marland Kitchen galantamine (RAZADYNE) 4 MG tablet Take 1 tablet by mouth daily.    . meclizine (ANTIVERT) 25 MG tablet Take 1 tablet by mouth daily.    . metoprolol succinate (TOPROL-XL) 50 MG 24 hr tablet Take 1 tablet by mouth daily.    Marland Kitchen oxyCODONE-acetaminophen (ROXICET) 5-325 MG tablet Take 1 tablet by mouth every 6 (six) hours as needed. 12 tablet 0  . pravastatin (PRAVACHOL) 40 MG tablet Take 1 tablet by mouth daily.       Discharge Medications: Please see discharge summary for a list of discharge medications.  Relevant Imaging Results:  Relevant Lab Results:   Additional Information  (SSN: [id number])  Zubayr Bednarczyk, Veronia Beets, LCSW

## 2016-07-29 NOTE — Progress Notes (Signed)
Clinical Education officer, museum (CSW) received a call from Dr. Mack Guise stating that patient was in his office for a follow up appointment for an ankle fracture and needs SNF placement. Patient has Administrator, sports. CSW spoke to patient's niece Claiborne Billings via telephone who accepted bed offer from [facility]. Per [facility] liaison patient can come to [facility] today room 808. Health Team authorization has been received. auth # [id number]. EMS will transport patient from MD office to [facility]. EMS has been called. Please reconsult if future social work needs arise. CSW signing off.   [employer], LCSW [phone]

## 2016-08-02 DIAGNOSIS — S82899A Other fracture of unspecified lower leg, initial encounter for closed fracture: Secondary | ICD-10-CM | POA: Diagnosis not present

## 2016-08-02 DIAGNOSIS — I10 Essential (primary) hypertension: Secondary | ICD-10-CM | POA: Diagnosis not present

## 2016-08-02 DIAGNOSIS — E784 Other hyperlipidemia: Secondary | ICD-10-CM | POA: Diagnosis not present

## 2016-08-02 DIAGNOSIS — K219 Gastro-esophageal reflux disease without esophagitis: Secondary | ICD-10-CM | POA: Diagnosis not present

## 2016-08-04 ENCOUNTER — Other Ambulatory Visit: Payer: Self-pay | Admitting: Orthopedic Surgery

## 2016-08-04 ENCOUNTER — Other Ambulatory Visit: Payer: PPO

## 2016-08-04 DIAGNOSIS — Z01810 Encounter for preprocedural cardiovascular examination: Secondary | ICD-10-CM | POA: Diagnosis not present

## 2016-08-05 ENCOUNTER — Ambulatory Visit
Admission: RE | Admit: 2016-08-05 | Discharge: 2016-08-05 | Disposition: A | Discharge status: Home / Self Care | Payer: PPO | Source: Ambulatory Visit | Attending: Orthopedic Surgery | Admitting: Orthopedic Surgery

## 2016-08-05 ENCOUNTER — Encounter
Admission: RE | Admit: 2016-08-05 | Discharge: 2016-08-05 | Disposition: A | Discharge status: Home / Self Care | Payer: PPO | Source: Ambulatory Visit | Attending: Orthopedic Surgery | Admitting: Orthopedic Surgery

## 2016-08-05 ENCOUNTER — Other Ambulatory Visit: Payer: Self-pay | Admitting: Orthopedic Surgery

## 2016-08-05 DIAGNOSIS — E785 Hyperlipidemia, unspecified: Secondary | ICD-10-CM | POA: Diagnosis not present

## 2016-08-05 DIAGNOSIS — Z881 Allergy status to other antibiotic agents status: Secondary | ICD-10-CM | POA: Diagnosis not present

## 2016-08-05 DIAGNOSIS — Z01810 Encounter for preprocedural cardiovascular examination: Secondary | ICD-10-CM

## 2016-08-05 DIAGNOSIS — Z885 Allergy status to narcotic agent status: Secondary | ICD-10-CM | POA: Diagnosis not present

## 2016-08-05 DIAGNOSIS — J449 Chronic obstructive pulmonary disease, unspecified: Secondary | ICD-10-CM | POA: Insufficient documentation

## 2016-08-05 DIAGNOSIS — Z7401 Bed confinement status: Secondary | ICD-10-CM | POA: Diagnosis not present

## 2016-08-05 DIAGNOSIS — D508 Other iron deficiency anemias: Secondary | ICD-10-CM | POA: Diagnosis not present

## 2016-08-05 DIAGNOSIS — Z01818 Encounter for other preprocedural examination: Secondary | ICD-10-CM | POA: Diagnosis not present

## 2016-08-05 DIAGNOSIS — S82851A Displaced trimalleolar fracture of right lower leg, initial encounter for closed fracture: Secondary | ICD-10-CM | POA: Diagnosis not present

## 2016-08-05 DIAGNOSIS — Z882 Allergy status to sulfonamides status: Secondary | ICD-10-CM | POA: Diagnosis not present

## 2016-08-05 DIAGNOSIS — Z5189 Encounter for other specified aftercare: Secondary | ICD-10-CM | POA: Diagnosis not present

## 2016-08-05 DIAGNOSIS — R262 Difficulty in walking, not elsewhere classified: Secondary | ICD-10-CM | POA: Diagnosis not present

## 2016-08-05 DIAGNOSIS — M25571 Pain in right ankle and joints of right foot: Secondary | ICD-10-CM | POA: Diagnosis not present

## 2016-08-05 DIAGNOSIS — I10 Essential (primary) hypertension: Secondary | ICD-10-CM | POA: Diagnosis not present

## 2016-08-05 DIAGNOSIS — Z888 Allergy status to other drugs, medicaments and biological substances status: Secondary | ICD-10-CM | POA: Diagnosis not present

## 2016-08-05 DIAGNOSIS — S8251XA Displaced fracture of medial malleolus of right tibia, initial encounter for closed fracture: Secondary | ICD-10-CM | POA: Diagnosis not present

## 2016-08-05 DIAGNOSIS — S82855A Nondisplaced trimalleolar fracture of left lower leg, initial encounter for closed fracture: Secondary | ICD-10-CM | POA: Diagnosis not present

## 2016-08-05 DIAGNOSIS — Z87891 Personal history of nicotine dependence: Secondary | ICD-10-CM | POA: Diagnosis not present

## 2016-08-05 DIAGNOSIS — K219 Gastro-esophageal reflux disease without esophagitis: Secondary | ICD-10-CM | POA: Diagnosis not present

## 2016-08-05 DIAGNOSIS — Z90710 Acquired absence of both cervix and uterus: Secondary | ICD-10-CM | POA: Diagnosis not present

## 2016-08-05 DIAGNOSIS — Z01812 Encounter for preprocedural laboratory examination: Secondary | ICD-10-CM

## 2016-08-05 DIAGNOSIS — E876 Hypokalemia: Secondary | ICD-10-CM | POA: Diagnosis not present

## 2016-08-05 DIAGNOSIS — S82851D Displaced trimalleolar fracture of right lower leg, subsequent encounter for closed fracture with routine healing: Secondary | ICD-10-CM | POA: Diagnosis not present

## 2016-08-05 DIAGNOSIS — D649 Anemia, unspecified: Secondary | ICD-10-CM | POA: Diagnosis not present

## 2016-08-05 DIAGNOSIS — Z85828 Personal history of other malignant neoplasm of skin: Secondary | ICD-10-CM | POA: Diagnosis not present

## 2016-08-05 DIAGNOSIS — M6281 Muscle weakness (generalized): Secondary | ICD-10-CM | POA: Diagnosis not present

## 2016-08-05 DIAGNOSIS — Z880 Allergy status to penicillin: Secondary | ICD-10-CM | POA: Diagnosis not present

## 2016-08-05 HISTORY — DX: Cardiac murmur, unspecified: R01.1

## 2016-08-05 HISTORY — DX: Anemia, unspecified: D64.9

## 2016-08-05 HISTORY — DX: Cardiac arrhythmia, unspecified: I49.9

## 2016-08-05 HISTORY — DX: Chronic obstructive pulmonary disease, unspecified: J44.9

## 2016-08-05 LAB — BASIC METABOLIC PANEL
Anion gap: 11 (ref 5–15)
BUN: 17 mg/dL (ref 6–20)
CHLORIDE: 96 mmol/L — AB (ref 101–111)
CO2: 32 mmol/L (ref 22–32)
Calcium: 10.2 mg/dL (ref 8.9–10.3)
Creatinine, Ser: 1.14 mg/dL — ABNORMAL HIGH (ref 0.44–1.00)
GFR calc non Af Amer: 47 mL/min — ABNORMAL LOW (ref >60)
GFR, EST AFRICAN AMERICAN: 54 mL/min — AB (ref >60)
Glucose, Bld: 110 mg/dL — ABNORMAL HIGH (ref 65–99)
POTASSIUM: 4.2 mmol/L (ref 3.5–5.1)
SODIUM: 139 mmol/L (ref 135–145)

## 2016-08-05 LAB — CBC WITH DIFFERENTIAL/PLATELET
Basophils Absolute: 0.1 10*3/uL (ref 0–0.1)
Basophils Relative: 1 %
Eosinophils Absolute: 0.4 10*3/uL (ref 0–0.7)
Eosinophils Relative: 3 %
HEMATOCRIT: 37.6 % (ref 35.0–47.0)
HEMOGLOBIN: 12.3 g/dL (ref 12.0–16.0)
LYMPHS ABS: 1.7 10*3/uL (ref 1.0–3.6)
LYMPHS PCT: 15 %
MCH: 29 pg (ref 26.0–34.0)
MCHC: 32.8 g/dL (ref 32.0–36.0)
MCV: 88.6 fL (ref 80.0–100.0)
MONOS PCT: 9 %
Monocytes Absolute: 1 10*3/uL — ABNORMAL HIGH (ref 0.2–0.9)
NEUTROS PCT: 72 %
Neutro Abs: 8.2 10*3/uL — ABNORMAL HIGH (ref 1.4–6.5)
Platelets: 290 10*3/uL (ref 150–440)
RBC: 4.24 MIL/uL (ref 3.80–5.20)
RDW: 14 % (ref 11.5–14.5)
WBC: 11.3 10*3/uL — AB (ref 3.6–11.0)

## 2016-08-05 LAB — PROTIME-INR
INR: 1.06
Prothrombin Time: 13.8 seconds (ref 11.4–15.2)

## 2016-08-05 LAB — APTT: aPTT: 29 seconds (ref 24–36)

## 2016-08-05 NOTE — Patient Instructions (Signed)
  Your procedure is scheduled on:Dec. 21 , 2017. Report to Same Day Surgery. To find out your arrival time please call [phone] today between 1PM - 3PM .  Remember: Instructions that are not followed completely may result in serious medical risk, up to and including death, or upon the discretion of your surgeon and anesthesiologist your surgery may need to be rescheduled.    _x___ 1. Do not eat food or drink liquids after midnight. No gum chewing or hard candies.     ____ 2. No Alcohol for 24 hours before or after surgery.   ____ 3. Bring all medications with you on the day of surgery if instructed.    __x__ 4. Notify your doctor if there is any change in your medical condition     (cold, fever, infections).    _____ 5. No smoking 24 hours prior to surgery.     Do not wear jewelry, make-up, hairpins, clips or nail polish.  Do not wear lotions, powders, or perfumes.   Do not shave 48 hours prior to surgery. Men may shave face and neck.  Do not bring valuables to the hospital.    [facility] is not responsible for any belongings or valuables.               Contacts, dentures or bridgework may not be worn into surgery.  Leave your suitcase in the car. After surgery it may be brought to your room.  For patients admitted to the hospital, discharge time is determined by your treatment team.   Patients discharged the day of surgery will not be allowed to drive home.    Please read over the following fact sheets that you were given:   [facility] Preparing for Surgery  _x___ Take these medicines the morning of surgery with A SIP OF WATER:    1. metoprolol succinate (TOPROL-XL)     2. omeprazole (PRILOSEC)  3. pravastatin (PRAVACHOL)  4.HYDROcodone-acetaminophen (NORCO/VICODIN) optional   ____ Fleet Enema (as directed)   _x___ Use CHG Soap as directed on instruction sheet  ____ Use inhalers on the day of surgery and bring to hospital day of surgery  ____ Stop metformin 2  days prior to surgery    ____ Take 1/2 of usual insulin dose the night before surgery and none on the morning of surgery.   ____ Stop Coumadin/Plavix/aspirin on does not apply.  _x___ Stop Anti-inflammatories such as Advil, Aleve, Ibuprofen, Motrin, Naproxen, Naprosyn, Goodies powders or aspirin products. OK to take Tylenol or  HYDROcodone-acetaminophen (NORCO/VICODIN) .   ____ Stop supplements until after surgery.    ____ Bring C-Pap to the hospital.

## 2016-08-06 ENCOUNTER — Encounter: Payer: Self-pay | Admitting: *Deleted

## 2016-08-06 ENCOUNTER — Inpatient Hospital Stay: Payer: PPO

## 2016-08-06 ENCOUNTER — Ambulatory Visit: Payer: PPO | Admitting: Registered Nurse

## 2016-08-06 ENCOUNTER — Inpatient Hospital Stay
Admission: AD | Admit: 2016-08-06 | Discharge: 2016-08-07 | DRG: 494 | Disposition: A | Discharge status: Skilled Nursing Facility | Payer: PPO | Source: Ambulatory Visit | Attending: Orthopedic Surgery | Admitting: Orthopedic Surgery

## 2016-08-06 ENCOUNTER — Other Ambulatory Visit: Payer: Self-pay | Admitting: *Deleted

## 2016-08-06 ENCOUNTER — Encounter
Admission: AD | Disposition: A | Discharge status: Skilled Nursing Facility | Payer: Self-pay | Source: Ambulatory Visit | Attending: Orthopedic Surgery

## 2016-08-06 DIAGNOSIS — Z882 Allergy status to sulfonamides status: Secondary | ICD-10-CM

## 2016-08-06 DIAGNOSIS — J449 Chronic obstructive pulmonary disease, unspecified: Secondary | ICD-10-CM | POA: Diagnosis present

## 2016-08-06 DIAGNOSIS — Z880 Allergy status to penicillin: Secondary | ICD-10-CM

## 2016-08-06 DIAGNOSIS — R262 Difficulty in walking, not elsewhere classified: Secondary | ICD-10-CM | POA: Diagnosis not present

## 2016-08-06 DIAGNOSIS — I10 Essential (primary) hypertension: Secondary | ICD-10-CM | POA: Diagnosis not present

## 2016-08-06 DIAGNOSIS — Z90710 Acquired absence of both cervix and uterus: Secondary | ICD-10-CM

## 2016-08-06 DIAGNOSIS — Z885 Allergy status to narcotic agent status: Secondary | ICD-10-CM | POA: Diagnosis not present

## 2016-08-06 DIAGNOSIS — Z87891 Personal history of nicotine dependence: Secondary | ICD-10-CM

## 2016-08-06 DIAGNOSIS — Z881 Allergy status to other antibiotic agents status: Secondary | ICD-10-CM

## 2016-08-06 DIAGNOSIS — E876 Hypokalemia: Secondary | ICD-10-CM | POA: Diagnosis not present

## 2016-08-06 DIAGNOSIS — E785 Hyperlipidemia, unspecified: Secondary | ICD-10-CM | POA: Diagnosis present

## 2016-08-06 DIAGNOSIS — K219 Gastro-esophageal reflux disease without esophagitis: Secondary | ICD-10-CM | POA: Diagnosis not present

## 2016-08-06 DIAGNOSIS — Z5189 Encounter for other specified aftercare: Secondary | ICD-10-CM | POA: Diagnosis not present

## 2016-08-06 DIAGNOSIS — S82851A Displaced trimalleolar fracture of right lower leg, initial encounter for closed fracture: Secondary | ICD-10-CM | POA: Diagnosis not present

## 2016-08-06 DIAGNOSIS — Z888 Allergy status to other drugs, medicaments and biological substances status: Secondary | ICD-10-CM

## 2016-08-06 DIAGNOSIS — Z85828 Personal history of other malignant neoplasm of skin: Secondary | ICD-10-CM | POA: Diagnosis not present

## 2016-08-06 DIAGNOSIS — M6281 Muscle weakness (generalized): Secondary | ICD-10-CM | POA: Diagnosis not present

## 2016-08-06 DIAGNOSIS — S82855A Nondisplaced trimalleolar fracture of left lower leg, initial encounter for closed fracture: Secondary | ICD-10-CM | POA: Diagnosis not present

## 2016-08-06 DIAGNOSIS — S82899A Other fracture of unspecified lower leg, initial encounter for closed fracture: Secondary | ICD-10-CM | POA: Diagnosis present

## 2016-08-06 DIAGNOSIS — D508 Other iron deficiency anemias: Secondary | ICD-10-CM | POA: Diagnosis not present

## 2016-08-06 DIAGNOSIS — D649 Anemia, unspecified: Secondary | ICD-10-CM | POA: Diagnosis not present

## 2016-08-06 DIAGNOSIS — S8251XA Displaced fracture of medial malleolus of right tibia, initial encounter for closed fracture: Secondary | ICD-10-CM | POA: Diagnosis not present

## 2016-08-06 HISTORY — PX: ORIF ANKLE FRACTURE: SHX5408

## 2016-08-06 SURGERY — OPEN REDUCTION INTERNAL FIXATION (ORIF) ANKLE FRACTURE
Anesthesia: General | Site: Ankle | Laterality: Right | Wound class: Clean

## 2016-08-06 MED ORDER — MECLIZINE HCL 25 MG PO TABS
25.0000 mg | ORAL_TABLET | ORAL | Status: DC | PRN
  Filled 2016-08-06: qty 1

## 2016-08-06 MED ORDER — BUPIVACAINE HCL 0.25 % IJ SOLN
INTRAMUSCULAR | Status: DC | PRN
  Administered 2016-08-06: 30 mL

## 2016-08-06 MED ORDER — BUPIVACAINE HCL (PF) 0.25 % IJ SOLN
INTRAMUSCULAR | Status: AC
  Filled 2016-08-06: qty 30

## 2016-08-06 MED ORDER — ONDANSETRON HCL 4 MG/2ML IJ SOLN
INTRAMUSCULAR | Status: DC | PRN
  Administered 2016-08-06: 4 mg via INTRAVENOUS

## 2016-08-06 MED ORDER — ONDANSETRON HCL 4 MG/2ML IJ SOLN
INTRAMUSCULAR | Status: AC
  Filled 2016-08-06: qty 2

## 2016-08-06 MED ORDER — DEXAMETHASONE SODIUM PHOSPHATE 10 MG/ML IJ SOLN
INTRAMUSCULAR | Status: DC | PRN
  Administered 2016-08-06: 10 mg via INTRAVENOUS

## 2016-08-06 MED ORDER — METHOCARBAMOL 500 MG PO TABS
500.0000 mg | ORAL_TABLET | Freq: Four times a day (QID) | ORAL | Status: DC | PRN
  Administered 2016-08-06 – 2016-08-07 (×2): 500 mg via ORAL
  Filled 2016-08-06 (×2): qty 1

## 2016-08-06 MED ORDER — PROPOFOL 10 MG/ML IV BOLUS
INTRAVENOUS | Status: AC
  Filled 2016-08-06: qty 20

## 2016-08-06 MED ORDER — POTASSIUM CHLORIDE CRYS ER 10 MEQ PO TBCR
10.0000 meq | EXTENDED_RELEASE_TABLET | Freq: Once | ORAL | Status: AC
  Administered 2016-08-06: 10 meq via ORAL
  Filled 2016-08-06: qty 1

## 2016-08-06 MED ORDER — MAGNESIUM CITRATE PO SOLN: 1.0000 | Freq: Once | ORAL | Status: DC | PRN

## 2016-08-06 MED ORDER — SODIUM CHLORIDE 0.9 % IV SOLN
75.0000 mL/h | INTRAVENOUS | Status: DC
  Administered 2016-08-06: 75 mL/h via INTRAVENOUS

## 2016-08-06 MED ORDER — NEOMYCIN-POLYMYXIN B GU 40-200000 IR SOLN
Status: DC | PRN
  Administered 2016-08-06: 2 mL

## 2016-08-06 MED ORDER — PROPOFOL 10 MG/ML IV BOLUS
INTRAVENOUS | Status: DC | PRN
  Administered 2016-08-06: 20 mg via INTRAVENOUS
  Administered 2016-08-06: 100 mg via INTRAVENOUS

## 2016-08-06 MED ORDER — SENNA 8.6 MG PO TABS
1.0000 | ORAL_TABLET | Freq: Two times a day (BID) | ORAL | Status: DC
  Administered 2016-08-06 – 2016-08-07 (×3): 8.6 mg via ORAL
  Filled 2016-08-06 (×2): qty 1

## 2016-08-06 MED ORDER — NEOMYCIN-POLYMYXIN B GU 40-200000 IR SOLN
Status: AC
  Filled 2016-08-06: qty 2

## 2016-08-06 MED ORDER — LIDOCAINE HCL (CARDIAC) 20 MG/ML IV SOLN
INTRAVENOUS | Status: DC | PRN
  Administered 2016-08-06: 60 mg via INTRAVENOUS

## 2016-08-06 MED ORDER — LORATADINE 10 MG PO TABS
10.0000 mg | ORAL_TABLET | Freq: Every day | ORAL | Status: DC
  Administered 2016-08-06 – 2016-08-07 (×2): 10 mg via ORAL
  Filled 2016-08-06 (×2): qty 1

## 2016-08-06 MED ORDER — FENTANYL CITRATE (PF) 100 MCG/2ML IJ SOLN
INTRAMUSCULAR | Status: AC
  Filled 2016-08-06: qty 2

## 2016-08-06 MED ORDER — MORPHINE SULFATE (PF) 2 MG/ML IV SOLN: 2.0000 mg | INTRAVENOUS | Status: DC | PRN

## 2016-08-06 MED ORDER — OXYCODONE HCL 5 MG PO TABS: 5.0000 mg | ORAL_TABLET | Freq: Once | ORAL | Status: DC | PRN

## 2016-08-06 MED ORDER — METOPROLOL SUCCINATE ER 50 MG PO TB24
50.0000 mg | ORAL_TABLET | Freq: Every day | ORAL | Status: DC
  Administered 2016-08-07: 50 mg via ORAL
  Filled 2016-08-06: qty 1

## 2016-08-06 MED ORDER — ENOXAPARIN SODIUM 40 MG/0.4ML ~~LOC~~ SOLN
40.0000 mg | SUBCUTANEOUS | Status: DC
  Administered 2016-08-07: 40 mg via SUBCUTANEOUS
  Filled 2016-08-06: qty 0.4

## 2016-08-06 MED ORDER — BISACODYL 10 MG RE SUPP: 10.0000 mg | Freq: Every day | RECTAL | Status: DC | PRN

## 2016-08-06 MED ORDER — FERROUS SULFATE 325 (65 FE) MG PO TABS
325.0000 mg | ORAL_TABLET | Freq: Three times a day (TID) | ORAL | Status: DC
  Administered 2016-08-06 – 2016-08-07 (×2): 325 mg via ORAL
  Filled 2016-08-06 (×2): qty 1

## 2016-08-06 MED ORDER — FENTANYL CITRATE (PF) 100 MCG/2ML IJ SOLN
INTRAMUSCULAR | Status: AC
  Administered 2016-08-06: 50 ug via INTRAVENOUS
  Filled 2016-08-06: qty 2

## 2016-08-06 MED ORDER — LIDOCAINE 2% (20 MG/ML) 5 ML SYRINGE
INTRAMUSCULAR | Status: AC
  Filled 2016-08-06: qty 5

## 2016-08-06 MED ORDER — SUGAMMADEX SODIUM 200 MG/2ML IV SOLN
INTRAVENOUS | Status: AC
  Filled 2016-08-06: qty 2

## 2016-08-06 MED ORDER — OXYCODONE HCL 5 MG/5ML PO SOLN
5.0000 mg | Freq: Once | ORAL | Status: DC | PRN
Start: 2016-08-06 — End: 2016-08-06

## 2016-08-06 MED ORDER — SUGAMMADEX SODIUM 200 MG/2ML IV SOLN
INTRAVENOUS | Status: DC | PRN
Start: 2016-08-06 — End: 2016-08-06
  Administered 2016-08-06: 100 mg via INTRAVENOUS

## 2016-08-06 MED ORDER — CHLORHEXIDINE GLUCONATE CLOTH 2 % EX PADS: 6.0000 | MEDICATED_PAD | Freq: Once | CUTANEOUS | Status: DC

## 2016-08-06 MED ORDER — POLYETHYLENE GLYCOL 3350 17 G PO PACK: 17.0000 g | PACK | Freq: Every day | ORAL | Status: DC | PRN

## 2016-08-06 MED ORDER — FENTANYL CITRATE (PF) 100 MCG/2ML IJ SOLN
INTRAMUSCULAR | Status: DC | PRN
  Administered 2016-08-06: 25 ug via INTRAVENOUS
  Administered 2016-08-06 (×3): 50 ug via INTRAVENOUS
  Administered 2016-08-06 (×2): 25 ug via INTRAVENOUS

## 2016-08-06 MED ORDER — FUROSEMIDE 20 MG PO TABS
30.0000 mg | ORAL_TABLET | Freq: Every day | ORAL | Status: DC
  Administered 2016-08-06 – 2016-08-07 (×2): 30 mg via ORAL
  Filled 2016-08-06 (×2): qty 2

## 2016-08-06 MED ORDER — ACETAMINOPHEN 10 MG/ML IV SOLN
INTRAVENOUS | Status: AC
  Filled 2016-08-06: qty 100

## 2016-08-06 MED ORDER — MENTHOL 3 MG MT LOZG: 1.0000 | LOZENGE | OROMUCOSAL | Status: DC | PRN

## 2016-08-06 MED ORDER — PRAVASTATIN SODIUM 40 MG PO TABS
40.0000 mg | ORAL_TABLET | Freq: Every day | ORAL | Status: DC
  Administered 2016-08-07: 40 mg via ORAL
  Filled 2016-08-06: qty 1

## 2016-08-06 MED ORDER — VANCOMYCIN HCL IN DEXTROSE 1-5 GM/200ML-% IV SOLN
1000.0000 mg | INTRAVENOUS | Status: AC
  Administered 2016-08-06: 1000 mg via INTRAVENOUS

## 2016-08-06 MED ORDER — DEXAMETHASONE SODIUM PHOSPHATE 10 MG/ML IJ SOLN
INTRAMUSCULAR | Status: AC
  Filled 2016-08-06: qty 1

## 2016-08-06 MED ORDER — VANCOMYCIN HCL IN DEXTROSE 1-5 GM/200ML-% IV SOLN
INTRAVENOUS | Status: AC
  Filled 2016-08-06: qty 200

## 2016-08-06 MED ORDER — DOCUSATE SODIUM 100 MG PO CAPS
100.0000 mg | ORAL_CAPSULE | Freq: Two times a day (BID) | ORAL | Status: DC
  Administered 2016-08-06 – 2016-08-07 (×3): 100 mg via ORAL
  Filled 2016-08-06 (×3): qty 1

## 2016-08-06 MED ORDER — ACETAMINOPHEN 650 MG RE SUPP: 650.0000 mg | Freq: Four times a day (QID) | RECTAL | Status: DC | PRN

## 2016-08-06 MED ORDER — PHENOL 1.4 % MT LIQD: 1.0000 | OROMUCOSAL | Status: DC | PRN

## 2016-08-06 MED ORDER — GALANTAMINE HYDROBROMIDE 4 MG PO TABS
4.0000 mg | ORAL_TABLET | Freq: Every day | ORAL | Status: DC
  Filled 2016-08-06: qty 1

## 2016-08-06 MED ORDER — PANTOPRAZOLE SODIUM 40 MG PO TBEC
40.0000 mg | DELAYED_RELEASE_TABLET | Freq: Every day | ORAL | Status: DC
  Administered 2016-08-07: 40 mg via ORAL
  Filled 2016-08-06: qty 1

## 2016-08-06 MED ORDER — ALUM & MAG HYDROXIDE-SIMETH 200-200-20 MG/5ML PO SUSP: 30.0000 mL | ORAL | Status: DC | PRN

## 2016-08-06 MED ORDER — VANCOMYCIN HCL IN DEXTROSE 1-5 GM/200ML-% IV SOLN
1000.0000 mg | Freq: Two times a day (BID) | INTRAVENOUS | Status: AC
  Administered 2016-08-06: 1000 mg via INTRAVENOUS
  Filled 2016-08-06: qty 200

## 2016-08-06 MED ORDER — OXYCODONE HCL 5 MG PO TABS
5.0000 mg | ORAL_TABLET | ORAL | Status: DC | PRN
  Administered 2016-08-06: 10 mg via ORAL
  Administered 2016-08-07: 5 mg via ORAL
  Administered 2016-08-07 (×2): 10 mg via ORAL
  Filled 2016-08-06: qty 1
  Filled 2016-08-06 (×3): qty 2

## 2016-08-06 MED ORDER — MIDAZOLAM HCL 2 MG/2ML IJ SOLN
INTRAMUSCULAR | Status: DC | PRN
  Administered 2016-08-06: 1 mg via INTRAVENOUS

## 2016-08-06 MED ORDER — ROCURONIUM BROMIDE 100 MG/10ML IV SOLN
INTRAVENOUS | Status: DC | PRN
  Administered 2016-08-06: 40 mg via INTRAVENOUS

## 2016-08-06 MED ORDER — MIDAZOLAM HCL 2 MG/2ML IJ SOLN
INTRAMUSCULAR | Status: AC
  Filled 2016-08-06: qty 2

## 2016-08-06 MED ORDER — ONDANSETRON HCL 4 MG PO TABS: 4.0000 mg | ORAL_TABLET | Freq: Four times a day (QID) | ORAL | Status: DC | PRN

## 2016-08-06 MED ORDER — MOMETASONE FURO-FORMOTEROL FUM 200-5 MCG/ACT IN AERO
2.0000 | INHALATION_SPRAY | Freq: Two times a day (BID) | RESPIRATORY_TRACT | Status: DC
  Administered 2016-08-06 – 2016-08-07 (×2): 2 via RESPIRATORY_TRACT
  Filled 2016-08-06: qty 8.8

## 2016-08-06 MED ORDER — METOLAZONE 2.5 MG PO TABS
2.5000 mg | ORAL_TABLET | ORAL | Status: DC
  Administered 2016-08-06: 2.5 mg via ORAL
  Filled 2016-08-06: qty 1

## 2016-08-06 MED ORDER — FENTANYL CITRATE (PF) 100 MCG/2ML IJ SOLN
25.0000 ug | INTRAMUSCULAR | Status: DC | PRN
Start: 2016-08-06 — End: 2016-08-06
  Administered 2016-08-06 (×3): 50 ug via INTRAVENOUS

## 2016-08-06 MED ORDER — METHOCARBAMOL 1000 MG/10ML IJ SOLN
500.0000 mg | Freq: Four times a day (QID) | INTRAMUSCULAR | Status: DC | PRN
  Filled 2016-08-06: qty 5

## 2016-08-06 MED ORDER — ACETAMINOPHEN 325 MG PO TABS: 650.0000 mg | ORAL_TABLET | Freq: Four times a day (QID) | ORAL | Status: DC | PRN

## 2016-08-06 MED ORDER — ONDANSETRON HCL 4 MG/2ML IJ SOLN: 4.0000 mg | Freq: Four times a day (QID) | INTRAMUSCULAR | Status: DC | PRN

## 2016-08-06 MED ORDER — LACTATED RINGERS IV SOLN
INTRAVENOUS | Status: DC
  Administered 2016-08-06: 08:00:00 via INTRAVENOUS

## 2016-08-06 MED ORDER — ACETAMINOPHEN 10 MG/ML IV SOLN
INTRAVENOUS | Status: DC | PRN
  Administered 2016-08-06: 1000 mg via INTRAVENOUS

## 2016-08-06 SURGICAL SUPPLY — 57 items
BANDAGE ELASTIC 4 LF NS (GAUZE/BANDAGES/DRESSINGS) ×6 IMPLANT
BIT DRILL 100X2XQC STRL (BIT) ×1 IMPLANT
BIT DRILL 2.5X110 QC LCP DISP (BIT) ×3 IMPLANT
BIT DRILL CANN 2.7X625 NONSTRL (BIT) ×3 IMPLANT
BIT DRILL QC 2.0X100 (BIT) ×2
BIT DRL 100X2XQC STRL (BIT) ×1
BLADE SURG 15 STRL LF DISP TIS (BLADE) ×1 IMPLANT
BLADE SURG 15 STRL SS (BLADE) ×2
BNDG ESMARK 6X12 TAN STRL LF (GAUZE/BANDAGES/DRESSINGS) ×3 IMPLANT
CLOSURE WOUND 1/2 X4 (GAUZE/BANDAGES/DRESSINGS) ×2
CUFF TOURN 24 STER (MISCELLANEOUS) IMPLANT
CUFF TOURN 30 STER DUAL PORT (MISCELLANEOUS) ×3 IMPLANT
DRAPE FLUOR MINI C-ARM 54X84 (DRAPES) ×3 IMPLANT
DRAPE INCISE IOBAN 66X45 STRL (DRAPES) IMPLANT
DRAPE U-SHAPE 47X51 STRL (DRAPES) ×3 IMPLANT
DURAPREP 26ML APPLICATOR (WOUND CARE) ×6 IMPLANT
ELECT CAUTERY BLADE TIP 2.5 (TIP) ×3
ELECTRODE CAUTERY BLDE TIP 2.5 (TIP) ×1 IMPLANT
GAUZE PETRO XEROFOAM 1X8 (MISCELLANEOUS) ×3 IMPLANT
GAUZE SPONGE 4X4 12PLY STRL (GAUZE/BANDAGES/DRESSINGS) ×3 IMPLANT
GLOVE BIOGEL PI IND STRL 9 (GLOVE) ×1 IMPLANT
GLOVE BIOGEL PI INDICATOR 9 (GLOVE) ×2
GLOVE SURG 9.0 ORTHO LTXF (GLOVE) ×6 IMPLANT
GOWN STRL REUS TWL 2XL XL LVL4 (GOWN DISPOSABLE) ×3 IMPLANT
GOWN STRL REUS W/ TWL LRG LVL3 (GOWN DISPOSABLE) ×1 IMPLANT
GOWN STRL REUS W/TWL LRG LVL3 (GOWN DISPOSABLE) ×2
HOLDER FOLEY CATH W/STRAP (MISCELLANEOUS) ×3 IMPLANT
K-WIRE PROS 0.6X70 (WIRE) ×3
KIT RM TURNOVER STRD PROC AR (KITS) ×3 IMPLANT
KWIRE PROS 0.6X70 (WIRE) ×1 IMPLANT
LABEL OR SOLS (LABEL) ×3 IMPLANT
NS IRRIG 1000ML POUR BTL (IV SOLUTION) ×3 IMPLANT
PACK EXTREMITY ARMC (MISCELLANEOUS) ×3 IMPLANT
PAD ABD DERMACEA PRESS 5X9 (GAUZE/BANDAGES/DRESSINGS) ×6 IMPLANT
PAD CAST CTTN 4X4 STRL (SOFTGOODS) ×3 IMPLANT
PADDING CAST COTTON 4X4 STRL (SOFTGOODS) ×6
PLATE LCP 3.5 1/3 TUB 8HX93 (Plate) ×3 IMPLANT
SCREW CANN L THRD/30 4.0 (Screw) ×6 IMPLANT
SCREW CORTEX 2.0X14 (Screw) ×3 IMPLANT
SCREW CORTEX 2.0X18 (Screw) IMPLANT
SCREW CORTEX 3.5 14MM (Screw) ×10 IMPLANT
SCREW CORTEX 3.5 18MM (Screw) ×2 IMPLANT
SCREW CORTEX 3.5 20MM (Screw) ×4 IMPLANT
SCREW LOCK CORT ST 3.5X14 (Screw) ×5 IMPLANT
SCREW LOCK CORT ST 3.5X18 (Screw) ×1 IMPLANT
SCREW LOCK CORT ST 3.5X20 (Screw) ×2 IMPLANT
SPLINT CAST 1 STEP 4X30 (MISCELLANEOUS) ×6 IMPLANT
SPONGE LAP 18X18 5 PK (GAUZE/BANDAGES/DRESSINGS) ×3 IMPLANT
STAPLER SKIN PROX 35W (STAPLE) ×3 IMPLANT
STOCKINETTE STRL 6IN 960660 (GAUZE/BANDAGES/DRESSINGS) ×3 IMPLANT
STRIP CLOSURE SKIN 1/2X4 (GAUZE/BANDAGES/DRESSINGS) ×4 IMPLANT
SUT VIC AB 0 CT1 36 (SUTURE) ×3 IMPLANT
SUT VIC AB 2-0 SH 27 (SUTURE) ×4
SUT VIC AB 2-0 SH 27XBRD (SUTURE) ×2 IMPLANT
SYR 30ML LL (SYRINGE) ×3 IMPLANT
TAPE MICROPORE 2IN (TAPE) ×3 IMPLANT
TRAY FOLEY W/METER SILVER 16FR (SET/KITS/TRAYS/PACK) ×3 IMPLANT

## 2016-08-06 NOTE — NC FL2 (Signed)
[place] LEVEL OF CARE SCREENING TOOL     IDENTIFICATION  Patient Name: Renee Meyer Birthdate: 11-09-1940 Sex: female Admission Date (Current Location): 08/06/2016  [place] and [place] Number:  Engineering geologist and Address:  [facility], [address] [place], [place] [address]      Provider Number: [id number]  Attending Physician Name and Address:  Thornton Park, MD  Relative Name and Phone Number:       Current Level of Care: Hospital Recommended Level of Care: [place] Prior Approval Number:    Date Approved/Denied:   PASRR Number:  ( [id number] )  Discharge Plan: SNF    Current Diagnoses: Patient Active Problem List   Diagnosis Date Noted  . Ankle fracture 08/06/2016  . Abnormal mammogram 08/01/2013    Orientation RESPIRATION BLADDER Height & Weight     Self, Time, Situation, Place  Normal Continent Weight:   Height:     BEHAVIORAL SYMPTOMS/MOOD NEUROLOGICAL BOWEL NUTRITION STATUS   (none)  (none) Continent Diet (Regular Diet )  AMBULATORY STATUS COMMUNICATION OF NEEDS Skin   Extensive Assist Verbally Surgical wounds (Incision: Right Leg )                       Personal Care Assistance Level of Assistance  Bathing, Feeding, Dressing Bathing Assistance: Limited assistance Feeding assistance: Independent Dressing Assistance: Limited assistance     Functional Limitations Info  Sight, Hearing, Speech Sight Info: Adequate Hearing Info: Adequate Speech Info: Adequate    SPECIAL CARE FACTORS FREQUENCY  PT (By licensed PT), OT (By licensed OT)     PT Frequency:  (5) OT Frequency:  (5)            Contractures      Additional Factors Info  Code Status, Allergies Code Status Info:  (Full Code. ) Allergies Info:  (Amoxicillin, Bisoprolol, Clindamycin/lincomycin, Codeine, Imdur Isosorbide Dinitrate, Penicillins, Sulfa Antibiotics, Zocor Simvastatin)           Current  Medications (08/06/2016):  This is the current hospital active medication list Current Facility-Administered Medications  Medication Dose Route Frequency Provider Last Rate Last Dose  . 0.9 %  sodium chloride infusion  75 mL/hr Intravenous Continuous Thornton Park, MD 75 mL/hr at 08/06/16 1518 75 mL/hr at 08/06/16 1518  . acetaminophen (TYLENOL) tablet 650 mg  650 mg Oral Q6H PRN Thornton Park, MD       Or  . acetaminophen (TYLENOL) suppository 650 mg  650 mg Rectal Q6H PRN Thornton Park, MD      . alum & mag hydroxide-simeth (MAALOX/MYLANTA) 200-200-20 MG/5ML suspension 30 mL  30 mL Oral Q4H PRN Thornton Park, MD      . bisacodyl (DULCOLAX) suppository 10 mg  10 mg Rectal Daily PRN Thornton Park, MD      . docusate sodium (COLACE) capsule 100 mg  100 mg Oral BID Thornton Park, MD   100 mg at 08/06/16 1521  . [START ON 08/07/2016] enoxaparin (LOVENOX) injection 40 mg  40 mg Subcutaneous Q24H Thornton Park, MD      . ferrous sulfate tablet 325 mg  325 mg Oral TID PC Thornton Park, MD   325 mg at 08/06/16 1521  . furosemide (LASIX) tablet 30 mg  30 mg Oral Daily Thornton Park, MD   30 mg at 08/06/16 1521  . galantamine (RAZADYNE) tablet 4 mg  4 mg Oral Daily Thornton Park, MD      . loratadine (  CLARITIN) tablet 10 mg  10 mg Oral Daily Thornton Park, MD   10 mg at 08/06/16 1521  . magnesium citrate solution 1 Bottle  1 Bottle Oral Once PRN Thornton Park, MD      . meclizine (ANTIVERT) tablet 25 mg  25 mg Oral PRN Thornton Park, MD      . menthol-cetylpyridinium (CEPACOL) lozenge 3 mg  1 lozenge Oral PRN Thornton Park, MD       Or  . phenol (CHLORASEPTIC) mouth spray 1 spray  1 spray Mouth/Throat PRN Thornton Park, MD      . methocarbamol (ROBAXIN) tablet 500 mg  500 mg Oral Q6H PRN Thornton Park, MD   500 mg at 08/06/16 1521   Or  . methocarbamol (ROBAXIN) 500 mg in dextrose 5 % 50 mL IVPB  500 mg Intravenous Q6H PRN Thornton Park, MD      . metolazone  (ZAROXOLYN) tablet 2.5 mg  2.5 mg Oral Once per day on Mon Thu Thornton Park, MD   2.5 mg at 08/06/16 1521  . [START ON 08/07/2016] metoprolol succinate (TOPROL-XL) 24 hr tablet 50 mg  50 mg Oral Daily Thornton Park, MD      . mometasone-formoterol [facility]) 200-5 MCG/ACT inhaler 2 puff  2 puff Inhalation BID Thornton Park, MD      . morphine 2 MG/ML injection 2 mg  2 mg Intravenous Q2H PRN Thornton Park, MD      . ondansetron [facility]) tablet 4 mg  4 mg Oral Q6H PRN Thornton Park, MD       Or  . ondansetron [facility]) injection 4 mg  4 mg Intravenous Q6H PRN Thornton Park, MD      . oxyCODONE (Oxy IR/ROXICODONE) immediate release tablet 5-10 mg  5-10 mg Oral Q4H PRN Thornton Park, MD   10 mg at 08/06/16 1521  . [START ON 08/07/2016] pantoprazole (PROTONIX) EC tablet 40 mg  40 mg Oral Daily Thornton Park, MD      . polyethylene glycol (MIRALAX / GLYCOLAX) packet 17 g  17 g Oral Daily PRN Thornton Park, MD      . Derrill Memo ON 08/07/2016] pravastatin (PRAVACHOL) tablet 40 mg  40 mg Oral Daily Thornton Park, MD      . senna [facility]) tablet 8.6 mg  1 tablet Oral BID Thornton Park, MD   8.6 mg at 08/06/16 1521  . vancomycin (VANCOCIN) IVPB 1000 mg/200 mL premix  1,000 mg Intravenous Q12H Thornton Park, MD         Discharge Medications: Please see discharge summary for a list of discharge medications.  Relevant Imaging Results:  Relevant Lab Results:   Additional Information  (SSN: [id number])  Jeremaih Klima, Veronia Beets, LCSW

## 2016-08-06 NOTE — Transfer of Care (Signed)
Immediate Anesthesia Transfer of Care Note  Patient: Renee Meyer  Procedure(s) Performed: Procedure(s) with comments: OPEN REDUCTION INTERNAL FIXATION (ORIF) ANKLE FRACTURE (Right) - SMALL FRAGMENT SET SYNTHES 4.0MM CANNULATED SCREW SET     Patient Location: PACU  Anesthesia Type:General  Level of Consciousness: responds to stimulation  Airway & Oxygen Therapy: Patient Spontanous Breathing and Patient connected to nasal cannula oxygen  Post-op Assessment: Report given to RN and Post -op Vital signs reviewed and stable  Post vital signs: Reviewed and stable  Last Vitals:  Vitals:   08/06/16 0801 08/06/16 1200  BP: (!) 168/91 (!) 168/77  Pulse: 86 81  Resp: 20 12  Temp: 36.6 C 36.3 C    Last Pain:  Vitals:   08/06/16 1200  TempSrc:   PainSc: Asleep         Complications: No apparent anesthesia complications

## 2016-08-06 NOTE — H&P (Signed)
The patient has been re-examined, and the chart reviewed, and there have been no interval changes to the documented history and physical.    The risks, benefits, and alternatives have been discussed at length, and the patient is willing to proceed.   

## 2016-08-06 NOTE — Anesthesia Procedure Notes (Signed)
Procedure Name: Intubation Date/Time: 08/06/2016 9:09 AM Performed by: Hedda Slade Pre-anesthesia Checklist: Patient identified, Emergency Drugs available, Suction available and Patient being monitored Patient Re-evaluated:Patient Re-evaluated prior to inductionOxygen Delivery Method: Circle system utilized Preoxygenation: Pre-oxygenation with 100% oxygen Intubation Type: IV induction Ventilation: Mask ventilation without difficulty and Oral airway inserted - appropriate to patient size Laryngoscope Size: Mac and 3 Grade View: Grade I Tube type: Oral Tube size: 7.0 mm Number of attempts: 1 Airway Equipment and Method: Stylet Dental Injury: Teeth and Oropharynx as per pre-operative assessment

## 2016-08-06 NOTE — Op Note (Signed)
08/06/2016  12:36 PM  PATIENT:  Renee Meyer    PRE-OPERATIVE DIAGNOSIS:  Right triamalleolar fracture, displaced  POST-OPERATIVE DIAGNOSIS:  Same  PROCEDURE:  OPEN REDUCTION INTERNAL FIXATION (ORIF) RIGHT TRIMALLEOLAR ANKLE FRACTURE  SURGEON:  Thornton Park, MD  ANESTHESIA:   General  PREOPERATIVE INDICATIONS:  Renee Meyer is a  75 y.o. female with a diagnosis of S82.855A Nondisplaced trimalleolar fracture of left lower leg, init who failed conservative measures and elected for surgical management.    I discussed the risks and benefits of surgery. The risks include but are not limited to infection, bleeding requiring blood transfusion, nerve or blood vessel injury, joint stiffness or loss of motion, persistent pain, weakness or instability, malunion, nonunion and hardware failure and the need for further surgery. Medical risks include but are not limited to DVT and pulmonary embolism, myocardial infarction, stroke, pneumonia, respiratory failure and death. Patient understood these risks and wished to proceed.   OPERATIVE IMPLANTS: Synthes 8 hole 1/3 tubular plate with 3.5 bicortical screws x 5 and 4.0  fully threaded cancellous screws x 2, Synthes 3.5 x 6m lag screw, Synthes minifrag 2.7 screw x 1 in distal fibula, 4.0 mm cannulated screws x 2 for medial malleolus fracture fixation  OPERATIVE FINDINGS: Displaced fractures of the medial and lateral malleolus.  Fibula had a sagittal split distally.  Posterior malleolus fragment was small and non-displaced.  OPERATIVE PROCEDURE:   Patient was met in the preoperative area. The right leg was signed my initials and the word yes according the hospital's correct site of surgery protocol.  She was brought to the operating room where she underwent general anesthesia. The patient was placed supine on the operative table. A bump was placed under the right hip. A tourniquet was applied to the right thigh.  The lower extremity was prepped and  draped in a sterile fashion. A timeout was performed to verify the patient's name, date of birth, medical record number, correct site of surgery and correct procedure to be performed. It was also used to verify the patient received antibiotics, and that all appropriate instruments, implants and radiographic studies were available in the room. Once all in attendance were in agreement, the case began.  The right lower extremity was exsanguinated with an Esmarch. The tourniquet was inflated to 275 mmHg but was increased after the start of the case to 3047m A lateral incision was made over the fibula. The subcutaneous tissues were dissected with the Metzenbaum scissor and pickup. Care was taken to avoid injury to the superficial peroneal nerve. The lateral malleolus fracture was identified and irrigated and fracture hematoma was removed. Soft tissue was removed from the fracture site using a periosteal elevator. A fracture reduction clamp was then used to reduce the fracture to an anatomic position.  A sagittal split of the distal fibula was identified.  A K-wire was placed to reduce the sagittal split.       The lateral malleolus was then drilled in an AP direction, perpendicular to the fracture site to allow for placement of the lag screw.   A single lag screw, 20 mm in length, was advanced across the fracture site by hand. This compressed the fracture.   A 8 hole, 1/3 tubular plate was then contoured and placed along the lateral fibula. Five bicortical screws were placed proximal to the fracture and two fully threaded cancellus screws were placed distal the fracture. The fracture reduction and hardware placement were confirmed on AP and lateral fluoroscan imaging.  Once the lateral malleolus was plated, the K wire was removed and replaced with a single minifrag 2.72m screw.   A small vertical incision was made over the tip of the medial malleolus and extended proximally by 2 cm.  Soft tissue was dissected with  some with the Metzenbaum scissor and pickup. The fracture of the medial malleolus was identified. This was reduced with a dental pick. 2 threaded K wires for the 4.0 cannulated screws were then advanced through the tip of the medial malleolus across the fracture site and into the distal tibia. The position of the K wires was evaluated on AP and lateral FluoroScan images. The length of the wires were measured with a depth gauge and were determined to be 30 mm in length. The wires were then overdrilled with a cannulated drill for the 4.0 cannulated screws. The two 30 mm long threaded 4.0 cannulated screws were then advanced into position by hand, compressing the medial malleolus fracture.    The posterior malleolus was then examined under fluoroscopy. It was felt to be less than 20% of the articular surface and was in a near anatomic position. The decision was made made not to place an AP screw given its stability and small size.  A stress test of the right ankle was then performed under fluoroscopy.  This test did not reveal any syndesmotic injury or opening of the medial clear space.  The medial and lateral incisions were then copiously irrigated. The subcutaneous tissue was closed with 2-0 Vicryl and the skin approximated staples. A dry sterile dressing was applied along with an AO splint. The patient's ankle was positioned in neutral. The pateint was then awoken from anesthesia, transferred to hospital bed and brought to the PACU in stable condition. I was scrubbed and present the entire case and all sharp and instrument counts were correct at conclusion the case. I spoke to the patient's sister in the post-op consultation room to let them know the case was performed without complication and the patient was stable in recovery room.    KTimoteo Gaul MD

## 2016-08-06 NOTE — Progress Notes (Signed)
  Subjective:  POST OP CHECK:  Status post ORIF of right ankle fracture. Patient was sleeping comfortably in bed when I arrived. She is easily arousable. Patient reports right ankle pain as mild at rest.  She had some right lower extremity muscle spasms earlier but these have stopped.  Objective:   VITALS:   Vitals:   08/06/16 1356 08/06/16 1445 08/06/16 1602 08/06/16 1755  BP:  (!) 144/53 (!) 128/47 (!) 126/58  Pulse:  72 74 77  Resp:  16 16 18   Temp:   98.2 F (36.8 C)   TempSrc:   Oral   SpO2: 98% 96% 99% 99%    PHYSICAL EXAM:  Right lower extremity: Patient's dressing and splint are clean dry and intact. She can flex and extend her toes and has intact sensation light touch in all 5 toes of the right foot. Her toes are well-perfused.   LABS  No results found for this or any previous visit (from the past 24 hour(s)).  Dg Chest 1 View  Result Date: 08/05/2016 CLINICAL DATA:  Preop for ankle fracture repair EXAM: CHEST 1 VIEW COMPARISON:  Chest radiograph 04/26/2014 FINDINGS: Mildly hyperexpanded lungs. No focal consolidation or edema. Cardiomediastinal contours are normal. No pleural effusion or pneumothorax. IMPRESSION: COPD without focal airspace disease. Electronically Signed   By: Ulyses Jarred M.D.   On: 08/05/2016 13:46   Dg Ankle Complete Right  Result Date: 08/06/2016 CLINICAL DATA:  75 year old female post surgery right ankle fracture. Subsequent encounter. EXAM: RIGHT ANKLE - COMPLETE 3+ VIEW COMPARISON:  07/26/2016 FINDINGS: Cast obscures fine osseous and soft tissue detail. Post reduction right fibular fracture with sideplate and screws as well as 2 solitary screws with much better alignment of fracture fragments. Reduction of medial malleolar fracture with 2 screws. Fracture of the distal posterior tibia not well assessed on present exam secondary to overlying fibular plate. Ankle mortise appears grossly intact. IMPRESSION: Open reduction and internal fixation of  right ankle fracture as noted above. Electronically Signed   By: Genia Del M.D.   On: 08/06/2016 13:17    Assessment/Plan: Day of Surgery   Active Problems:   Ankle fracture  I reviewed the postoperative x-ray. Patient's fractures are well reduced. Hardware is well positioned.  Patient's pain is currently controlled. Continue strict elevation the right lower extremity. Patient is nonweightbearing on the right lower extremity 6 weeks. She'll begin physical therapy tomorrow. Patient will have her Foley catheter removed in the morning. Postoperative labs will be drawn in the morning. Encourage incentive spirometry while awake. Patient will complete 24 hours postop antibiotics. Patient start Lovenox in the morning.    Thornton Park , MD 08/06/2016, 7:33 PM

## 2016-08-06 NOTE — Anesthesia Preprocedure Evaluation (Signed)
Anesthesia Evaluation  Patient identified by MRN, date of birth, ID band Patient awake    Reviewed: Allergy & Precautions, H&P , NPO status , Patient's Chart, lab work & pertinent test results  History of Anesthesia Complications Negative for: history of anesthetic complications  Airway Mallampati: III  TM Distance: <3 FB Neck ROM: limited    Dental no notable dental hx. (+) Poor Dentition, Chipped, Missing, Upper Dentures, Lower Dentures   Pulmonary neg shortness of breath, COPD, former smoker,    Pulmonary exam normal breath sounds clear to auscultation       Cardiovascular Exercise Tolerance: Good (-) angina(-) Past MI and (-) DOE Normal cardiovascular exam+ dysrhythmias + Valvular Problems/Murmurs  Rhythm:regular Rate:Normal     Neuro/Psych negative neurological ROS  negative psych ROS   GI/Hepatic Neg liver ROS, GERD  Controlled,  Endo/Other  negative endocrine ROS  Renal/GU ESRF     Musculoskeletal   Abdominal   Peds  Hematology negative hematology ROS (+)   Anesthesia Other Findings Past Medical History: No date: Anemia in her 59s: Cancer ([place])     Comment: basal cell carcinoma 2012: Colon polyps No date: COPD (chronic obstructive pulmonary disease) (* 2013: Cystitis No date: Dysrhythmia No date: GERD (gastroesophageal reflux disease) No date: Heart murmur No date: Hyperlipidemia  Past Surgical History: No date: ABDOMINAL HYSTERECTOMY July 31, 2013: BREAST SURGERY Right     Comment: 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 No date: CESAREAN SECTION 2012: COLONOSCOPY     Comment: Dr Vira Agar 2013: FRACTURE SURGERY Left     Comment: arm     Reproductive/Obstetrics negative OB ROS                             Anesthesia Physical Anesthesia Plan  ASA: III  Anesthesia Plan: General ETT   Post-op Pain Management:    Induction:   Airway Management Planned:    Additional Equipment:   Intra-op Plan:   Post-operative Plan:   Informed Consent: I have reviewed the patients History and Physical, chart, labs and discussed the procedure including the risks, benefits and alternatives for the proposed anesthesia with the patient or authorized representative who has indicated his/her understanding and acceptance.   Dental Advisory Given  Plan Discussed with: Anesthesiologist, CRNA and Surgeon  Anesthesia Plan Comments:         Anesthesia Quick Evaluation

## 2016-08-06 NOTE — Patient Outreach (Signed)
[facility]) Care Management  08/06/2016  CHEYNEY VELETA Jan 29, 1941 [id number]   Spoke with Dimple Nanas, SW at facility re: patient. He plans to do assessment this week for discharge planning needs. Patient due to have procedure today.   Plan will follow for any [facility] care management needs.  Royetta Crochet. Laymond Purser, RN, BSN, [place] [facility] Care Coordination [phone]

## 2016-08-06 NOTE — Anesthesia Postprocedure Evaluation (Signed)
Anesthesia Post Note  Patient: Renee Meyer  Procedure(s) Performed: Procedure(s) (LRB): OPEN REDUCTION INTERNAL FIXATION (ORIF) ANKLE FRACTURE (Right)  Patient location during evaluation: PACU Anesthesia Type: General Level of consciousness: awake and alert Pain management: pain level controlled Vital Signs Assessment: post-procedure vital signs reviewed and stable Respiratory status: spontaneous breathing, nonlabored ventilation, respiratory function stable and patient connected to nasal cannula oxygen Cardiovascular status: blood pressure returned to baseline and stable Postop Assessment: no signs of nausea or vomiting Anesthetic complications: no     Last Vitals:  Vitals:   08/06/16 1245 08/06/16 1300  BP: (!) 153/64 136/62  Pulse: 74 73  Resp: 11 11  Temp: 36.5 C     Last Pain:  Vitals:   08/06/16 1300  TempSrc:   PainSc: 3                  Precious Haws Amethyst Gainer

## 2016-08-06 NOTE — Plan of Care (Signed)
Problem: Safety: Goal: Ability to remain free from injury will improve Outcome: Progressing Pt educated on fall prevention bundle strategy. Educated on when to call for assistance, she verbalizes understanding.   Problem: Physical Regulation: Goal: Will remain free from infection Outcome: Progressing Educated on s/s of infections and when to notify RN

## 2016-08-06 NOTE — Clinical Social Work Note (Signed)
Clinical Social Work Assessment  Patient Details  Name: Renee Meyer MRN: [id number] Date of Birth: 1940/12/04  Date of referral:  08/06/16               Reason for consult:  Facility Placement, Other (Comment Required) (From [facility] )                Permission sought to share information with:  Chartered certified accountant granted to share information::  Yes, Verbal Permission Granted  Name::      [facility]   Agency::   [facility]   Relationship::     Contact Information:     Housing/Transportation Living arrangements for the past 2 months:  [place] of Information:  Patient Patient Interpreter Needed:  None Criminal Activity/Legal Involvement Pertinent to Current Situation/Hospitalization:  No - Comment as needed Significant Relationships:  Other Family Members, Spouse Lives with:  Spouse Do you feel safe going back to the place where you live?  Yes Need for family participation in patient care:  Yes (Comment)  Care giving concerns:  Clinical Social Worker (CSW) placed patient at [facility] on 07/26/16. Patient came into [facility] from [facility] for a planned surgery.    Social Worker assessment / plan:  CSW met with patient alone at bedside to address SNF consult. CSW introduced self and explained role of CSW department. Patient was very pleasant, alert and oriented X4, and sitting up in the bed. Patient reported that she has been at [facility] for a week and things have been great. Patient reported that she loves the staff at [facility] and is agreeable to return there. Per patient before she was placed at [facility] she was living in [place] with her husband Renee Meyer. Per [facility] liaison patient can return to [facility] when stable. FL2 complete and faxed out. CSW attempted to contact patient's husband Renee Meyer however it was not a working number. CSW will continue to follow and assist as needed.   Employment status:  Retired Nurse, adult PT  Recommendations:  Not assessed at this time Information / Referral to community resources:  [place]  Patient/Family's Response to care:  Patient is agreeable to return to [facility].   Patient/Family's Understanding of and Emotional Response to Diagnosis, Current Treatment, and Prognosis:  Patient was very pleasant and spoke fondly of [facility].   Emotional Assessment Appearance:  Appears stated age Attitude/Demeanor/Rapport:    Affect (typically observed):  Accepting, Adaptable, Pleasant Orientation:  Oriented to Self, Oriented to Place, Oriented to  Time, Oriented to Situation Alcohol / Substance use:  Not Applicable Psych involvement (Current and /or in the community):  No (Comment)  Discharge Needs  Concerns to be addressed:  Discharge Planning Concerns Readmission within the last 30 days:  No Current discharge risk:  Dependent with Mobility Barriers to Discharge:  Continued Medical Work up   [employer], Veronia Beets, LCSW 08/06/2016, 5:23 PM

## 2016-08-07 DIAGNOSIS — M25571 Pain in right ankle and joints of right foot: Secondary | ICD-10-CM | POA: Diagnosis not present

## 2016-08-07 DIAGNOSIS — Z7401 Bed confinement status: Secondary | ICD-10-CM | POA: Diagnosis not present

## 2016-08-07 DIAGNOSIS — Z5189 Encounter for other specified aftercare: Secondary | ICD-10-CM | POA: Diagnosis not present

## 2016-08-07 DIAGNOSIS — E876 Hypokalemia: Secondary | ICD-10-CM | POA: Diagnosis not present

## 2016-08-07 DIAGNOSIS — I10 Essential (primary) hypertension: Secondary | ICD-10-CM | POA: Diagnosis not present

## 2016-08-07 DIAGNOSIS — R262 Difficulty in walking, not elsewhere classified: Secondary | ICD-10-CM | POA: Diagnosis not present

## 2016-08-07 DIAGNOSIS — E785 Hyperlipidemia, unspecified: Secondary | ICD-10-CM | POA: Diagnosis not present

## 2016-08-07 DIAGNOSIS — K219 Gastro-esophageal reflux disease without esophagitis: Secondary | ICD-10-CM | POA: Diagnosis not present

## 2016-08-07 DIAGNOSIS — M6281 Muscle weakness (generalized): Secondary | ICD-10-CM | POA: Diagnosis not present

## 2016-08-07 DIAGNOSIS — D508 Other iron deficiency anemias: Secondary | ICD-10-CM | POA: Diagnosis not present

## 2016-08-07 LAB — CBC
HCT: 32.2 % — ABNORMAL LOW (ref 35.0–47.0)
HEMOGLOBIN: 10.7 g/dL — AB (ref 12.0–16.0)
MCH: 29.2 pg (ref 26.0–34.0)
MCHC: 33.2 g/dL (ref 32.0–36.0)
MCV: 87.8 fL (ref 80.0–100.0)
Platelets: 279 10*3/uL (ref 150–440)
RBC: 3.67 MIL/uL — AB (ref 3.80–5.20)
RDW: 13.7 % (ref 11.5–14.5)
WBC: 12.9 10*3/uL — ABNORMAL HIGH (ref 3.6–11.0)

## 2016-08-07 LAB — BASIC METABOLIC PANEL
Anion gap: 10 (ref 5–15)
BUN: 18 mg/dL (ref 6–20)
CHLORIDE: 99 mmol/L — AB (ref 101–111)
CO2: 29 mmol/L (ref 22–32)
CREATININE: 1.03 mg/dL — AB (ref 0.44–1.00)
Calcium: 8.8 mg/dL — ABNORMAL LOW (ref 8.9–10.3)
GFR calc Af Amer: >60 mL/min (ref >60)
GFR calc non Af Amer: 53 mL/min — ABNORMAL LOW (ref >60)
GLUCOSE: 116 mg/dL — AB (ref 65–99)
POTASSIUM: 3.5 mmol/L (ref 3.5–5.1)
SODIUM: 138 mmol/L (ref 135–145)

## 2016-08-07 MED ORDER — OXYCODONE HCL 5 MG PO TABS: 5.0000 mg | ORAL_TABLET | ORAL | 0 refills | Status: DC | PRN

## 2016-08-07 NOTE — Discharge Summary (Signed)
Physician Discharge Summary  Patient ID: Renee Meyer MRN: [id number] DOB/AGE: 1940/10/21 75 y.o.  Admit date: 08/06/2016 Discharge date: 08/07/2016  Admission Diagnoses:  Displaced right trimalleolar ankle fracture  <principal problem not specified>  Discharge Diagnoses:  Displaced right trimalleolar ankle fracture s/p ORIF Active Problems:   Ankle fracture   Past Medical History:  Diagnosis Date  . Anemia   . Cancer ([place]) in her 75s   basal cell carcinoma  . Colon polyps 2012  . COPD (chronic obstructive pulmonary disease) ([place])   . Cystitis 2013  . Dysrhythmia   . GERD (gastroesophageal reflux disease)   . Heart murmur   . Hyperlipidemia     Surgeries: Procedure(s): OPEN REDUCTION INTERNAL FIXATION (ORIF) ANKLE FRACTURE on 08/06/2016   Consultants (if any):   Discharged Condition: Improved  Hospital Course: Renee Meyer is an 75 y.o. female who was admitted 08/06/2016 with a diagnosis of displaced right trimalleolar ankle fracture and went to the operating room on 08/06/2016 and underwent An uncomplicated ORIF of the right ankle.    She was given perioperative antibiotics:  Anti-infectives    Start     Dose/Rate Route Frequency Ordered Stop   08/06/16 1500  vancomycin (VANCOCIN) IVPB 1000 mg/200 mL premix     1,000 mg 200 mL/hr over 60 Minutes Intravenous Every 12 hours 08/06/16 1355 08/06/16 2146   08/06/16 0823  vancomycin (VANCOCIN) 1-5 GM/200ML-% IVPB    Comments:  Rexanne Mano: cabinet override      08/06/16 0823 08/06/16 0913   08/06/16 0301  vancomycin (VANCOCIN) IVPB 1000 mg/200 mL premix     1,000 mg 200 mL/hr over 60 Minutes Intravenous On call to O.R. 08/06/16 0301 08/06/16 1013    . Patient is admitted to the orthopedic floor postoperatively. She was admitted for pain control and neurovascular monitoring. Patient did well following surgery. Her pain was controlled. She made progress with physical therapy. Given her clinical improvement she  is prepared for discharge to skilled nursing facility on postop day #1. Patient played 24 hours postop antibiotics and a Foley catheter was removed on postop day #1.  She was given sequential compression devices, early ambulation, and Lovenox for DVT prophylaxis.  She benefited maximally from the hospital stay and there were no complications.    Recent vital signs:  Vitals:   08/07/16 0335 08/07/16 0828  BP: 135/63 (!) 120/104  Pulse: 81 87  Resp: 18 16  Temp: 97.9 F (36.6 C) 98.2 F (36.8 C)    Recent laboratory studies:  Lab Results  Component Value Date   HGB 10.7 (L) 08/07/2016   HGB 12.3 08/05/2016   HGB 12.0 04/26/2014   Lab Results  Component Value Date   WBC 12.9 (H) 08/07/2016   PLT 279 08/07/2016   Lab Results  Component Value Date   INR 1.06 08/05/2016   Lab Results  Component Value Date   NA 138 08/07/2016   K 3.5 08/07/2016   CL 99 (L) 08/07/2016   CO2 29 08/07/2016   BUN 18 08/07/2016   CREATININE 1.03 (H) 08/07/2016   GLUCOSE 116 (H) 08/07/2016    Discharge Medications:   Allergies as of 08/07/2016      Reactions   Amoxicillin Hives   Bisoprolol Hives   Clindamycin/lincomycin Hives   Codeine    headache   Imdur [isosorbide Dinitrate]    Penicillins Hives   Sulfa Antibiotics Hives   Zocor [simvastatin] Swelling      Medication List  STOP taking these medications   oxyCODONE-acetaminophen 5-325 MG tablet Commonly known as:  ROXICET     TAKE these medications   ferrous sulfate 325 (65 FE) MG tablet Take 325 mg by mouth daily with breakfast.   furosemide 20 MG tablet Commonly known as:  LASIX Take 20 mg by mouth. 1.5 tablets per day.   GLUCOSAMINE-CALCIUM-VIT D PO Take 1 tablet by mouth daily.   loratadine 10 MG tablet Commonly known as:  CLARITIN Take 10 mg by mouth daily.   magnesium oxide 400 MG tablet Commonly known as:  MAG-OX Take 400 mg by mouth daily.   meclizine 25 MG tablet Commonly known as:  ANTIVERT Take  1 tablet by mouth as needed.   metolazone 2.5 MG tablet Commonly known as:  ZAROXOLYN Take 2.5 mg by mouth 2 (two) times a week.   metoprolol succinate 50 MG 24 hr tablet Commonly known as:  TOPROL-XL Take 1 tablet by mouth daily.   omeprazole 20 MG capsule Commonly known as:  PRILOSEC Take 20 mg by mouth daily.   oxyCODONE 5 MG immediate release tablet Commonly known as:  Oxy IR/ROXICODONE Take 1-2 tablets (5-10 mg total) by mouth every 4 (four) hours as needed for breakthrough pain ((for MODERATE breakthrough pain)).   potassium chloride 10 MEQ tablet Commonly known as:  K-DUR,KLOR-CON Take 10 mEq by mouth once.   pravastatin 40 MG tablet Commonly known as:  PRAVACHOL Take 1 tablet by mouth daily.   vitamin B-12 1000 MCG tablet Commonly known as:  CYANOCOBALAMIN Take 1,000 mcg by mouth daily.       Diagnostic Studies: Dg Chest 1 View  Result Date: 08/05/2016 CLINICAL DATA:  Preop for ankle fracture repair EXAM: CHEST 1 VIEW COMPARISON:  Chest radiograph 04/26/2014 FINDINGS: Mildly hyperexpanded lungs. No focal consolidation or edema. Cardiomediastinal contours are normal. No pleural effusion or pneumothorax. IMPRESSION: COPD without focal airspace disease. Electronically Signed   By: Ulyses Jarred M.D.   On: 08/05/2016 13:46   Dg Ankle Complete Right  Result Date: 08/06/2016 CLINICAL DATA:  75 year old female post surgery right ankle fracture. Subsequent encounter. EXAM: RIGHT ANKLE - COMPLETE 3+ VIEW COMPARISON:  07/26/2016 FINDINGS: Cast obscures fine osseous and soft tissue detail. Post reduction right fibular fracture with sideplate and screws as well as 2 solitary screws with much better alignment of fracture fragments. Reduction of medial malleolar fracture with 2 screws. Fracture of the distal posterior tibia not well assessed on present exam secondary to overlying fibular plate. Ankle mortise appears grossly intact. IMPRESSION: Open reduction and internal fixation  of right ankle fracture as noted above. Electronically Signed   By: Genia Del M.D.   On: 08/06/2016 13:17   Dg Ankle Complete Right  Result Date: 07/26/2016 CLINICAL DATA:  Fall this morning with right ankle injury EXAM: RIGHT ANKLE - COMPLETE 3+ VIEW COMPARISON:  None. FINDINGS: Marked diffuse right ankle soft tissue swelling. Oblique right lateral malleolus fracture with minimal 3 mm posterior/lateral displacement of the dominant distal fracture fragment. Transverse nondisplaced right medial malleolus fracture. Posterior malleolus fracture in the right distal tibia without significant displacement. No subluxation at the right ankle mortise. No suspicious focal osseous lesion. No radiopaque foreign body. IMPRESSION: Minimally displaced trimalleolar right ankle fracture as described. No subluxation. Marked diffuse soft tissue swelling. Electronically Signed   By: Ilona Sorrel M.D.   On: 07/26/2016 15:31    Disposition: 01-Home or Self Care  Discharge Instructions    Call MD / Call 911  Complete by:  As directed    If you experience chest pain or shortness of breath, CALL 911 and be transported to the hospital emergency room.  If you develope a fever above 101 F, pus (white drainage) or increased drainage or redness at the wound, or calf pain, call your surgeon's office.   Constipation Prevention    Complete by:  As directed    Drink plenty of fluids.  Prune juice may be helpful.  You may use a stool softener, such as Colace (over the counter) 100 mg twice a day.  Use MiraLax (over the counter) for constipation as needed.   Diet general    Complete by:  As directed    Discharge instructions    Complete by:  As directed    Patient will be nonweightbearing on the right lower extremity for 6 weeks postoperatively. She should keep her went clean dry and intact until her follow-up in the office in approximately 10-14 days. She should continue strict elevation of the right lower extremity.  Continue to apply ice to the right ankle while in bed.  Patient should be on Lovenox 40 mg by mouth daily while at the skilled nursing facility. She may be discharged home from the skilled nursing facility on enteric-coated aspirin 325 mg by mouth twice a day for DVT prophylaxis at home.  She should continue physical and occupational therapy at skilled nursing facility for gait training, safety evaluation and right lower extremity strengthening of her quadriceps and hip abductor muscles.   Driving restrictions    Complete by:  As directed    No driving for 6 weeks   Increase activity slowly as tolerated    Complete by:  As directed    Lifting restrictions    Complete by:  As directed    No lifting for 12-16 weeks      Contact information for after-discharge care    Destination    [facility] .   Specialty:  [place] information: [address] [place] [phone]               Signed: Thornton Park ,MD 08/07/2016, 9:48 AM

## 2016-08-07 NOTE — Progress Notes (Signed)
Patient is medically stable for D/C to [facility] today. Health Team authorization has been received. [facility] # [id number]. Per [facility] liaison patient will go back to room 808. RN will call report to RN Yaakov Guthrie at [phone] and arrange EMS for transport. Clinical Education officer, museum (CSW) sent D/C orders to [facility] via [facility]. Patient is aware of above. Patient's sister Blanch Media is at bedside and aware of above. Please reconsult if future social work needs arise. CSW signing off.   [employer], LCSW [phone]

## 2016-08-07 NOTE — Progress Notes (Signed)
Subjective:  Postoperative day #1 status post right ankle ORIF. Patient reports pain as mild.  Patient is up out of bed to a chair. She states her pain is well-controlled. She denies any muscle spasms today. She has no other complaints.  Objective:   VITALS:   Vitals:   08/06/16 1755 08/06/16 1934 08/07/16 0335 08/07/16 0828  BP: (!) 126/58 136/63 135/63 (!) 120/104  Pulse: 77 80 81 87  Resp: 18 18 18 16   Temp:  97.9 F (36.6 C) 97.9 F (36.6 C) 98.2 F (36.8 C)  TempSrc:  Oral Oral Oral  SpO2: 99% 95% 95% 93%    PHYSICAL EXAM:  Right lower extremity: Patient's splint and dressing remained clean dry and intact. Her toes on the right foot are well-perfused. She can flex and extend all 5 toes and has intact sensation light touch over all toes and the first dorsal webspace.  LABS  Results for orders placed or performed during the hospital encounter of 08/06/16 (from the past 24 hour(s))  CBC     Status: Abnormal   Collection Time: 08/07/16  4:14 AM  Result Value Ref Range   WBC 12.9 (H) 3.6 - 11.0 K/uL   RBC 3.67 (L) 3.80 - 5.20 MIL/uL   Hemoglobin 10.7 (L) 12.0 - 16.0 g/dL   HCT 32.2 (L) 35.0 - 47.0 %   MCV 87.8 80.0 - 100.0 fL   MCH 29.2 26.0 - 34.0 pg   MCHC 33.2 32.0 - 36.0 g/dL   RDW 13.7 11.5 - 14.5 %   Platelets 279 150 - 440 K/uL  Basic metabolic panel     Status: Abnormal   Collection Time: 08/07/16  4:14 AM  Result Value Ref Range   Sodium 138 135 - 145 mmol/L   Potassium 3.5 3.5 - 5.1 mmol/L   Chloride 99 (L) 101 - 111 mmol/L   CO2 29 22 - 32 mmol/L   Glucose, Bld 116 (H) 65 - 99 mg/dL   BUN 18 6 - 20 mg/dL   Creatinine, Ser 1.03 (H) 0.44 - 1.00 mg/dL   Calcium 8.8 (L) 8.9 - 10.3 mg/dL   GFR calc non Af Amer 53 (L) >60 mL/min   GFR calc Af Amer >60 >60 mL/min   Anion gap 10 5 - 15    Dg Chest 1 View  Result Date: 08/05/2016 CLINICAL DATA:  Preop for ankle fracture repair EXAM: CHEST 1 VIEW COMPARISON:  Chest radiograph 04/26/2014 FINDINGS: Mildly  hyperexpanded lungs. No focal consolidation or edema. Cardiomediastinal contours are normal. No pleural effusion or pneumothorax. IMPRESSION: COPD without focal airspace disease. Electronically Signed   By: Ulyses Jarred M.D.   On: 08/05/2016 13:46   Dg Ankle Complete Right  Result Date: 08/06/2016 CLINICAL DATA:  75 year old female post surgery right ankle fracture. Subsequent encounter. EXAM: RIGHT ANKLE - COMPLETE 3+ VIEW COMPARISON:  07/26/2016 FINDINGS: Cast obscures fine osseous and soft tissue detail. Post reduction right fibular fracture with sideplate and screws as well as 2 solitary screws with much better alignment of fracture fragments. Reduction of medial malleolar fracture with 2 screws. Fracture of the distal posterior tibia not well assessed on present exam secondary to overlying fibular plate. Ankle mortise appears grossly intact. IMPRESSION: Open reduction and internal fixation of right ankle fracture as noted above. Electronically Signed   By: Genia Del M.D.   On: 08/06/2016 13:17    Assessment/Plan: 1 Day Post-Op   Active Problems:   Ankle fracture  Patient is doing well  postop. She will continue physical therapy. Her Foley catheter is removed today. She will complete 24 hours postop antibiotics. Given her clinical condition she could be discharged to skilled nursing facility today if a bed is available.    Thornton Park , MD 08/07/2016, 9:37 AM

## 2016-08-07 NOTE — Evaluation (Signed)
Occupational Therapy Evaluation Patient Details Name: Renee Meyer MRN: [id number] DOB: 10-26-1940 Today's Date: 08/07/2016    History of Present Illness Pt. is a 75 y.o. female who was admitted to [facility] for an ORIF repair of a right Trimalleolar Ankle Fx.   Clinical Impression   Pt. Is a 75 y.o. Female who was admitted to [facility] for an ORIF repair of a right trimalleolar Ankle Fx. Pt. Presents with weakness, NWB, and limited mobility during ADLs which hinder independence with ADL/IADLs. Pt. Could benefit from skilled OT services for ADL training, A/E training, UE there. Ex, and pt. Education about work simplification strategies, home modification, and DME. Pt. Plans to return to SNF at [facility] for continued therapy upon discharge.     Follow Up Recommendations  SNF    Equipment Recommendations  3 in 1 bedside commode    Recommendations for Other Services       Precautions / Restrictions Restrictions Weight Bearing Restrictions: Yes RLE Weight Bearing: Non weight bearing                                                     ADL Overall ADL's : Needs assistance/impaired Eating/Feeding: Set up   Grooming: Set up           Upper Body Dressing : Set up   Lower Body Dressing: Minimal assistance (with A/E use) on Left               Functional mobility during ADLs: Min guard (sit to stand in preparation for transfers.) General ADL Comments: Pt. education was provided about A/E use for LE ADLs.     Vision     Perception     Praxis      Pertinent Vitals/Pain Pain Assessment: No/denies pain Pain Score: 0-No pain     Hand Dominance Right   Extremity/Trunk Assessment Upper Extremity Assessment Upper Extremity Assessment: Overall WFL for tasks assessed           Communication Communication Communication: No difficulties   Cognition Arousal/Alertness: Awake/alert Behavior During Therapy: WFL for tasks assessed/performed Overall  Cognitive Status: Within Functional Limits for tasks assessed                     General Comments       Exercises       Shoulder Instructions      Home Living Family/patient expects to be discharged to:: Skilled nursing facility Living Arrangements: Spouse/significant other Available Help at Discharge: Family Type of Home: House (Pt. reports her house is small) Home Access: Ramped entrance     Home Layout: One level     Bathroom Shower/Tub: Tub/shower unit;Curtain Shower/tub characteristics: Architectural technologist: Standard     Home Equipment: Environmental consultant - 2 wheels          Prior Functioning/Environment Level of Independence: Independent                 OT Problem List: Decreased strength;Pain;Decreased knowledge of use of DME or AE;Decreased range of motion   OT Treatment/Interventions: Self-care/ADL training;Therapeutic exercise;Therapeutic activities;Energy conservation;Visual/perceptual remediation/compensation;Patient/family education;DME and/or AE instruction    OT Goals(Current goals can be found in the care plan section) Acute Rehab OT Goals Patient Stated Goal: To return home, and regain independence OT Goal Formulation: With patient Potential to Achieve Goals:  Good  OT Frequency: Min 1X/week   Barriers to D/C:            Co-evaluation              End of Session    Activity Tolerance: Patient tolerated treatment well Patient left: in chair;with call bell/phone within reach;with chair alarm set   Time: 1025-1100 OT Time Calculation (min): 35 min Charges:  OT General Charges $OT Visit: 1 Procedure OT Evaluation $OT Eval Moderate Complexity: 1 Procedure G-Codes:    Harrel Carina, MS, OTR/L 08/07/2016, 11:24 AM

## 2016-08-07 NOTE — Progress Notes (Signed)
Pt being discharged to [facility] today. Report called to Romie Minus, all questions answered. PIV removed. Discharge instructions provided in packet along with needed prescriptions. Patient is aware and in agreement of plan. She is leaving with all her belongings. She will be transported via EMS.

## 2016-08-07 NOTE — Evaluation (Signed)
Physical Therapy Evaluation Patient Details Name: Renee Meyer MRN: [id number] DOB: [birth year] Today's Date: 08/07/2016   History of Present Illness  Pt. is a 75 y.o. female who was admitted to [facility] for an ORIF repair of a right Trimalleolar Ankle Fx.  Clinical Impression  Pt eager to work with PT but is limited as she is [place] and therefore only able to hop with the walker.  She did respond well with ~10 minutes of mobility and gait training apart from the PT exam and walked/hopped 35-40 ft with good relatively safety but she did fatigue quickly and was unsure about being able to manage at home given her current limitations.    Follow Up Recommendations SNF    Equipment Recommendations       Recommendations for Other Services       Precautions / Restrictions Restrictions Weight Bearing Restrictions: Yes RLE Weight Bearing: Non weight bearing      Mobility  Bed Mobility Overal bed mobility: Needs Assistance Bed Mobility: Supine to Sit;Sit to Supine     Supine to sit: Min guard Sit to supine: Min guard   General bed mobility comments: Pt was able to get herself to sitting at EOB w/o assist and showed good general confidence  Transfers Overall transfer level: Needs assistance Equipment used: Rolling walker (2 wheeled) Transfers: Sit to/from Stand Sit to Stand: Min assist         General transfer comment: Pt is able to rise to standing while maintaining NWBing and despite some hesitancy she generally did well.   Ambulation/Gait Ambulation/Gait assistance: Min guard Ambulation Distance (Feet): 35 Feet Assistive device: Rolling walker (2 wheeled)       General Gait Details: Pt was able to maintain R NWBing and hop with decent relative confidence.  She fatigued with the effort, but overall was surprised at how she did  Financial trader [place] (Stroke Patients Only)       Balance Overall balance assessment: Needs  assistance   Sitting balance-Leahy Scale: Good       Standing balance-Leahy Scale: Fair                               Pertinent Vitals/Pain Pain Assessment: 0-10 Pain Score: 5  (typical throbbing post op pain in gravity dependent position)    Home Living Family/patient expects to be discharged to:: Skilled nursing facility Living Arrangements: Spouse/significant other Available Help at Discharge: Family Type of Home: House Home Access:  (pt has one step at the threshold)     Home Layout: One level Home Equipment: Walker - 2 wheels      Prior Function Level of Independence: Independent               Hand Dominance   Dominant Hand: Right    Extremity/Trunk Assessment   Upper Extremity Assessment Upper Extremity Assessment: Overall WFL for tasks assessed    Lower Extremity Assessment Lower Extremity Assessment: Overall WFL for tasks assessed (R LE limited secondary to casting, grossly functional)       Communication   Communication: No difficulties  Cognition Arousal/Alertness: Awake/alert Behavior During Therapy: WFL for tasks assessed/performed Overall Cognitive Status: Within Functional Limits for tasks assessed                      General Comments  Exercises     Assessment/Plan    PT Assessment Patient needs continued PT services  PT Problem List Decreased strength;Decreased range of motion;Decreased activity tolerance;Decreased balance;Decreased mobility;Decreased coordination;Decreased cognition;Decreased knowledge of use of DME;Decreased safety awareness;Pain          PT Treatment Interventions DME instruction;Gait training;Stair training;Functional mobility training;Therapeutic activities;Therapeutic exercise;Balance training;Patient/family education    PT Goals (Current goals can be found in the Care Plan section)  Acute Rehab PT Goals Patient Stated Goal: To return home, and regain independence PT Goal  Formulation: With patient Time For Goal Achievement: 08/14/16 Potential to Achieve Goals: Good    Frequency BID   Barriers to discharge        Co-evaluation               End of Session Equipment Utilized During Treatment: Gait belt Activity Tolerance: Patient limited by fatigue Patient left: with chair alarm set;with call bell/phone within reach;with nursing/sitter in room           Time: [id number] PT Time Calculation (min) (ACUTE ONLY): 27 min   Charges:   PT Evaluation $PT Eval Low Complexity: 1 Procedure PT Treatments $Gait Training: 8-22 mins   PT G Codes:        Kreg Shropshire, DPT 08/07/2016, 11:51 AM

## 2016-08-12 ENCOUNTER — Other Ambulatory Visit: Payer: Self-pay

## 2016-08-12 DIAGNOSIS — N390 Urinary tract infection, site not specified: Secondary | ICD-10-CM | POA: Diagnosis not present

## 2016-08-12 NOTE — Patient Outreach (Signed)
[place] [facility]) Care Management  08/12/2016  Renee Meyer [birth year] [id number]     Transition of Care Referral  Referral Date: 08/12/16 Referral Source: HTA Discharge Report Date of Discharge: 08/06/16 Facility: [facility] Insurance: HTA   Outreach attempt # 1 to patient. No answer at present. RN CM left HIPAA compliant voicemail message along with contact info.  Plan: RN CM will make outreach attempt to patient within two business days if no return call from patient.   Enzo Montgomery, RN,BSN,CCM [place] Management Telephonic Care Management Coordinator Direct Phone: [phone] Toll Free: [phone] Fax: [phone]

## 2016-08-12 NOTE — Patient Outreach (Signed)
[facility]) Care Management  08/12/2016  VIRGINIA GEARHEART [birth year] [id number]     Transition of Care Referral  Referral Date: 08/12/16 Referral Source: HTA Discharge Report Date of Discharge: 08/06/16 Facility: [facility]  Insurance: HTA   Outreach attempt # 1 to patient. No answer at present. RN CM left HIPAA compliant voicemail message along with contact info.     Plan: RN CM will make outreach attempt to patient within one business day if no return call from patient.     Enzo Montgomery, RN,BSN,CCM [place] Management Telephonic Care Management Coordinator Direct Phone: [phone] Toll Free: [phone] Fax: [phone]

## 2016-08-13 ENCOUNTER — Other Ambulatory Visit: Payer: Self-pay | Admitting: *Deleted

## 2016-08-13 ENCOUNTER — Other Ambulatory Visit: Payer: Self-pay

## 2016-08-13 DIAGNOSIS — K219 Gastro-esophageal reflux disease without esophagitis: Secondary | ICD-10-CM | POA: Diagnosis not present

## 2016-08-13 DIAGNOSIS — M6281 Muscle weakness (generalized): Secondary | ICD-10-CM | POA: Diagnosis not present

## 2016-08-13 DIAGNOSIS — E784 Other hyperlipidemia: Secondary | ICD-10-CM | POA: Diagnosis not present

## 2016-08-13 DIAGNOSIS — M84671A Pathological fracture in other disease, right ankle, initial encounter for fracture: Secondary | ICD-10-CM | POA: Diagnosis not present

## 2016-08-13 DIAGNOSIS — I10 Essential (primary) hypertension: Secondary | ICD-10-CM | POA: Diagnosis not present

## 2016-08-13 NOTE — Patient Outreach (Signed)
[place] [facility]) Care Management  08/13/2016  Renee Meyer January 25, 1941 [id number]   Met with patient at facility. Patient reports she is still non-weight bearing on rt leg, due to recent ankle surgery. She sees MD next week, hoping to get some weight bearing status. She does not plan to stay at facility past her 20 days as she cannot afford the co-pays after the 20 days.  She states she has supportive family and spouse. No issues getting medications, she states that they do have "extra-help" for medications. Patient does not want to sign up for [facility] community care management at this time.  Patient given a brochure RNCM contact for future reference. Reminded patient that she may get phone calls after she went home to assess her status and any care management needs, she agreed to calls.   Plan to let [facility] telephonic RNCM  know that patient is still at facility, no discharge date set but patient plans to go home around 08/26/16 due to co-pay days.  Unable to meet with SW at facility today as he was in meetings.   Plan  Left message for Dimple Nanas, SW re: patient co-pay days.  Will let [facility] telephonic RNCM know that patient is still at [place] and of tentative discharge date.  Will be available for any new [facility] program discharge planning needs.    Renee Crochet. Laymond Purser, RN, BSN, [facility] [phone]) Business Cell  [phone]) Toll Free Office

## 2016-08-13 NOTE — Patient Outreach (Signed)
[place] [facility]) Care Management  08/13/2016  Renee Meyer [birth year] [id number]   Transition of Care Referral  Referral Date: 08/12/16 Referral Source: HTA Discharge Report Date of Discharge: 08/06/16 Facility: [facility]  Insurance: HTA   Outreach attempt #2 to patient. No answer at present.   Plan: RN CM will make outreach attempt to patient within three business days if no return call from patient.   Enzo Montgomery, RN,BSN,CCM [place] Management Telephonic Care Management Coordinator Direct Phone: [phone] Toll Free: [phone] Fax: [phone]

## 2016-08-14 ENCOUNTER — Other Ambulatory Visit: Payer: Self-pay

## 2016-08-14 ENCOUNTER — Ambulatory Visit: Payer: Self-pay

## 2016-08-14 NOTE — Patient Outreach (Signed)
[place] [facility]) Care Management  08/14/2016  TYLEIGH CABO 09-10-1940 [id number]   Transition of Care Referral  Referral Date: 08/12/16 Referral Source: HTA Discharge Report Date of Discharge: 08/06/16 Facility: [facility]  Insurance: HTA   Confirmed with [facility] Coordinator that patient remains at [facility] facility and possible discharge date is 08/26/16. RN CM will close case out at this time as patient remains inpatient.    Plan: RN CM will notify [facility] administrative assistant of case closure status and that patient remains inpatient.   Enzo Montgomery, RN,BSN,CCM [place] Management Telephonic Care Management Coordinator Direct Phone: [phone] Toll Free: [phone] Fax: [phone]

## 2016-08-17 DIAGNOSIS — K219 Gastro-esophageal reflux disease without esophagitis: Secondary | ICD-10-CM | POA: Diagnosis not present

## 2016-08-17 DIAGNOSIS — S82851D Displaced trimalleolar fracture of right lower leg, subsequent encounter for closed fracture with routine healing: Secondary | ICD-10-CM | POA: Diagnosis not present

## 2016-08-17 DIAGNOSIS — E785 Hyperlipidemia, unspecified: Secondary | ICD-10-CM | POA: Diagnosis not present

## 2016-08-17 DIAGNOSIS — Z5189 Encounter for other specified aftercare: Secondary | ICD-10-CM | POA: Diagnosis not present

## 2016-08-17 DIAGNOSIS — I10 Essential (primary) hypertension: Secondary | ICD-10-CM | POA: Diagnosis not present

## 2016-08-17 DIAGNOSIS — M6281 Muscle weakness (generalized): Secondary | ICD-10-CM | POA: Diagnosis not present

## 2016-08-17 DIAGNOSIS — R262 Difficulty in walking, not elsewhere classified: Secondary | ICD-10-CM | POA: Diagnosis not present

## 2016-08-17 DIAGNOSIS — D508 Other iron deficiency anemias: Secondary | ICD-10-CM | POA: Diagnosis not present

## 2016-08-18 ENCOUNTER — Other Ambulatory Visit: Payer: Self-pay | Admitting: *Deleted

## 2016-08-18 DIAGNOSIS — S82891A Other fracture of right lower leg, initial encounter for closed fracture: Secondary | ICD-10-CM

## 2016-08-18 NOTE — Patient Outreach (Signed)
Referral from SW at facility, patient will be discharging 08/19/16 from facility. Discussed that this RNCM had spoken with patient last week and she agrees to telephone follow up.  Will place order for telephonic Transition of care calls.  Royetta Crochet. Laymond Purser, RN, BSN, [facility] Liaison [phone]

## 2016-08-20 ENCOUNTER — Other Ambulatory Visit: Payer: Self-pay

## 2016-08-20 NOTE — Patient Outreach (Signed)
[place] [facility]) Care Management  08/20/2016  Renee Meyer 29-Aug-1940 [id number]     Transition of Care Referral  Referral Date: 08/19/16 Referral Source: [facility] Coordinator Date of Discharge: 08/19/16 Facility: [facility] Diagnosis: Right ankle ORIF Insurance: HTA   Outreach attempt # 1 to patient. No answer at present. RN CM left HIPAA compliant voicemail message along with contact info.    Plan: RN CM will make outreach attempt to patient within three business days if no return call from patient.   Enzo Montgomery, RN,BSN,CCM [place] Management Telephonic Care Management Coordinator Direct Phone: [phone] Toll Free: [phone] Fax: [phone]

## 2016-08-21 ENCOUNTER — Other Ambulatory Visit: Payer: Self-pay

## 2016-08-21 NOTE — Patient Outreach (Signed)
[place] [facility]) Care Management  08/21/2016  Renee Meyer 08-31-1940 [id number]   Transition of Care Referral  Referral Date: 08/19/16 Referral Source: [facility] Coordinator Date of Discharge: 08/19/16 Facility: [facility] Diagnosis: Right ankle ORIF Insurance: HTA    Outreach attempt #2 to patient. Spoke with patient who states she could not talk long as her phone battery was very low. RN CM offered to call patient back at more convenient time but she declined.  Discussed with patient her meeting [facility] Coordinator during facility stay at [facility] and that she had offered [facility] transition of care program to patient. Patient states she recalls discussion and RN CM continued to further explain program. She then reported that she doesn't feel like she needs services anymore. Patient states she is "doing just fine" since return home and feels that having a nurse call her weekly "would be too much and not needed." Patient appreciative of call but declined [facility] services at this time. Patient has already received [facility] letter, brochure and magnet in the mail. Advised to call for any future needs or concerns.    Plan: RN CM will notify [facility] administrative assistant of case closure.  Enzo Montgomery, RN,BSN,CCM [place] Management Telephonic Care Management Coordinator Direct Phone: [phone] Toll Free: [phone] Fax: [phone]

## 2016-08-24 DIAGNOSIS — S82851D Displaced trimalleolar fracture of right lower leg, subsequent encounter for closed fracture with routine healing: Secondary | ICD-10-CM | POA: Diagnosis not present

## 2016-09-23 DIAGNOSIS — S82851D Displaced trimalleolar fracture of right lower leg, subsequent encounter for closed fracture with routine healing: Secondary | ICD-10-CM | POA: Diagnosis not present

## 2016-10-07 DIAGNOSIS — S82851D Displaced trimalleolar fracture of right lower leg, subsequent encounter for closed fracture with routine healing: Secondary | ICD-10-CM | POA: Diagnosis not present

## 2016-11-02 DIAGNOSIS — S82851D Displaced trimalleolar fracture of right lower leg, subsequent encounter for closed fracture with routine healing: Secondary | ICD-10-CM | POA: Diagnosis not present

## 2016-12-09 DIAGNOSIS — Z79899 Other long term (current) drug therapy: Secondary | ICD-10-CM | POA: Diagnosis not present

## 2016-12-15 DIAGNOSIS — M818 Other osteoporosis without current pathological fracture: Secondary | ICD-10-CM | POA: Diagnosis not present

## 2016-12-15 DIAGNOSIS — D500 Iron deficiency anemia secondary to blood loss (chronic): Secondary | ICD-10-CM | POA: Diagnosis not present

## 2016-12-15 DIAGNOSIS — J449 Chronic obstructive pulmonary disease, unspecified: Secondary | ICD-10-CM | POA: Diagnosis not present

## 2016-12-15 DIAGNOSIS — D369 Benign neoplasm, unspecified site: Secondary | ICD-10-CM | POA: Insufficient documentation

## 2016-12-15 DIAGNOSIS — E782 Mixed hyperlipidemia: Secondary | ICD-10-CM | POA: Diagnosis not present

## 2016-12-15 DIAGNOSIS — Z0000 Encounter for general adult medical examination without abnormal findings: Secondary | ICD-10-CM | POA: Insufficient documentation

## 2016-12-15 DIAGNOSIS — Z79899 Other long term (current) drug therapy: Secondary | ICD-10-CM | POA: Diagnosis not present

## 2016-12-15 DIAGNOSIS — E538 Deficiency of other specified B group vitamins: Secondary | ICD-10-CM | POA: Insufficient documentation

## 2016-12-28 DIAGNOSIS — S82851D Displaced trimalleolar fracture of right lower leg, subsequent encounter for closed fracture with routine healing: Secondary | ICD-10-CM | POA: Diagnosis not present

## 2017-01-15 DIAGNOSIS — R21 Rash and other nonspecific skin eruption: Secondary | ICD-10-CM | POA: Diagnosis not present

## 2017-01-15 DIAGNOSIS — R600 Localized edema: Secondary | ICD-10-CM | POA: Diagnosis not present

## 2017-02-08 DIAGNOSIS — R600 Localized edema: Secondary | ICD-10-CM | POA: Diagnosis not present

## 2017-02-08 DIAGNOSIS — R0609 Other forms of dyspnea: Secondary | ICD-10-CM | POA: Diagnosis not present

## 2017-02-08 DIAGNOSIS — L03115 Cellulitis of right lower limb: Secondary | ICD-10-CM | POA: Diagnosis not present

## 2017-02-12 DIAGNOSIS — S82851D Displaced trimalleolar fracture of right lower leg, subsequent encounter for closed fracture with routine healing: Secondary | ICD-10-CM | POA: Diagnosis not present

## 2017-02-12 DIAGNOSIS — L03115 Cellulitis of right lower limb: Secondary | ICD-10-CM | POA: Diagnosis not present

## 2017-02-12 DIAGNOSIS — I872 Venous insufficiency (chronic) (peripheral): Secondary | ICD-10-CM | POA: Diagnosis not present

## 2017-02-15 DIAGNOSIS — R0609 Other forms of dyspnea: Secondary | ICD-10-CM | POA: Diagnosis not present

## 2017-02-18 DIAGNOSIS — L03115 Cellulitis of right lower limb: Secondary | ICD-10-CM | POA: Diagnosis not present

## 2017-02-18 DIAGNOSIS — R600 Localized edema: Secondary | ICD-10-CM | POA: Diagnosis not present

## 2017-02-18 DIAGNOSIS — L270 Generalized skin eruption due to drugs and medicaments taken internally: Secondary | ICD-10-CM | POA: Diagnosis not present

## 2017-02-25 DIAGNOSIS — R600 Localized edema: Secondary | ICD-10-CM | POA: Diagnosis not present

## 2017-02-25 DIAGNOSIS — L03115 Cellulitis of right lower limb: Secondary | ICD-10-CM | POA: Diagnosis not present

## 2017-02-26 DIAGNOSIS — S82851D Displaced trimalleolar fracture of right lower leg, subsequent encounter for closed fracture with routine healing: Secondary | ICD-10-CM | POA: Diagnosis not present

## 2017-03-05 DIAGNOSIS — Z1231 Encounter for screening mammogram for malignant neoplasm of breast: Secondary | ICD-10-CM | POA: Diagnosis not present

## 2017-03-11 DIAGNOSIS — N3090 Cystitis, unspecified without hematuria: Secondary | ICD-10-CM | POA: Diagnosis not present

## 2017-03-11 DIAGNOSIS — L03115 Cellulitis of right lower limb: Secondary | ICD-10-CM | POA: Diagnosis not present

## 2017-03-17 DIAGNOSIS — S82851D Displaced trimalleolar fracture of right lower leg, subsequent encounter for closed fracture with routine healing: Secondary | ICD-10-CM | POA: Diagnosis not present

## 2017-06-10 DIAGNOSIS — M818 Other osteoporosis without current pathological fracture: Secondary | ICD-10-CM | POA: Diagnosis not present

## 2017-06-10 DIAGNOSIS — E782 Mixed hyperlipidemia: Secondary | ICD-10-CM | POA: Diagnosis not present

## 2017-06-10 DIAGNOSIS — D500 Iron deficiency anemia secondary to blood loss (chronic): Secondary | ICD-10-CM | POA: Diagnosis not present

## 2017-06-10 DIAGNOSIS — Z79899 Other long term (current) drug therapy: Secondary | ICD-10-CM | POA: Diagnosis not present

## 2017-06-15 DIAGNOSIS — L821 Other seborrheic keratosis: Secondary | ICD-10-CM | POA: Diagnosis not present

## 2017-06-15 DIAGNOSIS — D485 Neoplasm of uncertain behavior of skin: Secondary | ICD-10-CM | POA: Diagnosis not present

## 2017-06-15 DIAGNOSIS — L853 Xerosis cutis: Secondary | ICD-10-CM | POA: Diagnosis not present

## 2017-06-15 DIAGNOSIS — D0462 Carcinoma in situ of skin of left upper limb, including shoulder: Secondary | ICD-10-CM | POA: Diagnosis not present

## 2017-06-17 DIAGNOSIS — E782 Mixed hyperlipidemia: Secondary | ICD-10-CM | POA: Diagnosis not present

## 2017-06-17 DIAGNOSIS — M8588 Other specified disorders of bone density and structure, other site: Secondary | ICD-10-CM | POA: Diagnosis not present

## 2017-06-17 DIAGNOSIS — E538 Deficiency of other specified B group vitamins: Secondary | ICD-10-CM | POA: Diagnosis not present

## 2017-06-17 DIAGNOSIS — Z23 Encounter for immunization: Secondary | ICD-10-CM | POA: Diagnosis not present

## 2017-06-17 DIAGNOSIS — M818 Other osteoporosis without current pathological fracture: Secondary | ICD-10-CM | POA: Diagnosis not present

## 2017-06-17 DIAGNOSIS — D369 Benign neoplasm, unspecified site: Secondary | ICD-10-CM | POA: Diagnosis not present

## 2017-06-21 DIAGNOSIS — D0462 Carcinoma in situ of skin of left upper limb, including shoulder: Secondary | ICD-10-CM | POA: Diagnosis not present

## 2017-09-16 DIAGNOSIS — D369 Benign neoplasm, unspecified site: Secondary | ICD-10-CM | POA: Diagnosis not present

## 2017-09-16 DIAGNOSIS — J449 Chronic obstructive pulmonary disease, unspecified: Secondary | ICD-10-CM | POA: Diagnosis not present

## 2017-09-16 DIAGNOSIS — Z86010 Personal history of colonic polyps: Secondary | ICD-10-CM | POA: Diagnosis not present

## 2017-09-16 DIAGNOSIS — K219 Gastro-esophageal reflux disease without esophagitis: Secondary | ICD-10-CM | POA: Diagnosis not present

## 2017-09-20 ENCOUNTER — Encounter: Payer: Self-pay | Admitting: *Deleted

## 2017-09-21 ENCOUNTER — Ambulatory Visit: Payer: PPO | Admitting: Anesthesiology

## 2017-09-21 ENCOUNTER — Encounter: Payer: Self-pay | Admitting: *Deleted

## 2017-09-21 ENCOUNTER — Ambulatory Visit
Admission: RE | Admit: 2017-09-21 | Discharge: 2017-09-21 | Disposition: A | Discharge status: Home / Self Care | Payer: PPO | Source: Ambulatory Visit | Attending: Internal Medicine | Admitting: Internal Medicine

## 2017-09-21 ENCOUNTER — Encounter
Admission: RE | Disposition: A | Discharge status: Home / Self Care | Payer: Self-pay | Source: Ambulatory Visit | Attending: Internal Medicine

## 2017-09-21 DIAGNOSIS — Z880 Allergy status to penicillin: Secondary | ICD-10-CM | POA: Insufficient documentation

## 2017-09-21 DIAGNOSIS — K635 Polyp of colon: Secondary | ICD-10-CM | POA: Diagnosis not present

## 2017-09-21 DIAGNOSIS — Z8719 Personal history of other diseases of the digestive system: Secondary | ICD-10-CM | POA: Diagnosis not present

## 2017-09-21 DIAGNOSIS — Z885 Allergy status to narcotic agent status: Secondary | ICD-10-CM | POA: Insufficient documentation

## 2017-09-21 DIAGNOSIS — I10 Essential (primary) hypertension: Secondary | ICD-10-CM | POA: Insufficient documentation

## 2017-09-21 DIAGNOSIS — Z882 Allergy status to sulfonamides status: Secondary | ICD-10-CM | POA: Insufficient documentation

## 2017-09-21 DIAGNOSIS — Z79899 Other long term (current) drug therapy: Secondary | ICD-10-CM | POA: Diagnosis not present

## 2017-09-21 DIAGNOSIS — K219 Gastro-esophageal reflux disease without esophagitis: Secondary | ICD-10-CM | POA: Diagnosis not present

## 2017-09-21 DIAGNOSIS — Z888 Allergy status to other drugs, medicaments and biological substances status: Secondary | ICD-10-CM | POA: Insufficient documentation

## 2017-09-21 DIAGNOSIS — D127 Benign neoplasm of rectosigmoid junction: Secondary | ICD-10-CM | POA: Insufficient documentation

## 2017-09-21 DIAGNOSIS — Z86010 Personal history of colonic polyps: Secondary | ICD-10-CM | POA: Insufficient documentation

## 2017-09-21 DIAGNOSIS — K640 First degree hemorrhoids: Secondary | ICD-10-CM | POA: Insufficient documentation

## 2017-09-21 DIAGNOSIS — J449 Chronic obstructive pulmonary disease, unspecified: Secondary | ICD-10-CM | POA: Diagnosis not present

## 2017-09-21 DIAGNOSIS — Z881 Allergy status to other antibiotic agents status: Secondary | ICD-10-CM | POA: Insufficient documentation

## 2017-09-21 DIAGNOSIS — Z85828 Personal history of other malignant neoplasm of skin: Secondary | ICD-10-CM | POA: Insufficient documentation

## 2017-09-21 DIAGNOSIS — M818 Other osteoporosis without current pathological fracture: Secondary | ICD-10-CM | POA: Insufficient documentation

## 2017-09-21 DIAGNOSIS — E669 Obesity, unspecified: Secondary | ICD-10-CM | POA: Diagnosis not present

## 2017-09-21 DIAGNOSIS — Z87891 Personal history of nicotine dependence: Secondary | ICD-10-CM | POA: Insufficient documentation

## 2017-09-21 DIAGNOSIS — E538 Deficiency of other specified B group vitamins: Secondary | ICD-10-CM | POA: Diagnosis not present

## 2017-09-21 DIAGNOSIS — Z1211 Encounter for screening for malignant neoplasm of colon: Secondary | ICD-10-CM | POA: Insufficient documentation

## 2017-09-21 DIAGNOSIS — E782 Mixed hyperlipidemia: Secondary | ICD-10-CM | POA: Diagnosis not present

## 2017-09-21 DIAGNOSIS — D125 Benign neoplasm of sigmoid colon: Secondary | ICD-10-CM | POA: Diagnosis not present

## 2017-09-21 DIAGNOSIS — Z6837 Body mass index (BMI) 37.0-37.9, adult: Secondary | ICD-10-CM | POA: Diagnosis not present

## 2017-09-21 DIAGNOSIS — K648 Other hemorrhoids: Secondary | ICD-10-CM | POA: Diagnosis not present

## 2017-09-21 HISTORY — PX: COLONOSCOPY WITH PROPOFOL: SHX5780

## 2017-09-21 SURGERY — COLONOSCOPY WITH PROPOFOL
Anesthesia: General

## 2017-09-21 MED ORDER — LIDOCAINE HCL (CARDIAC) 20 MG/ML IV SOLN
INTRAVENOUS | Status: DC | PRN
  Administered 2017-09-21: 80 mg via INTRAVENOUS

## 2017-09-21 MED ORDER — PROPOFOL 10 MG/ML IV BOLUS
INTRAVENOUS | Status: DC | PRN
  Administered 2017-09-21 (×2): 50 mg via INTRAVENOUS
  Administered 2017-09-21: 100 mg via INTRAVENOUS

## 2017-09-21 MED ORDER — PROPOFOL 10 MG/ML IV BOLUS
INTRAVENOUS | Status: AC
  Filled 2017-09-21: qty 20

## 2017-09-21 MED ORDER — LACTATED RINGERS IV SOLN
INTRAVENOUS | Status: DC | PRN
  Administered 2017-09-21: 12:00:00 via INTRAVENOUS

## 2017-09-21 MED ORDER — SODIUM CHLORIDE 0.9 % IV SOLN
INTRAVENOUS | Status: DC
  Administered 2017-09-21: 1000 mL via INTRAVENOUS

## 2017-09-21 MED ORDER — PROPOFOL 500 MG/50ML IV EMUL
INTRAVENOUS | Status: DC | PRN
  Administered 2017-09-21: 75 ug/kg/min via INTRAVENOUS

## 2017-09-21 NOTE — Anesthesia Post-op Follow-up Note (Signed)
Anesthesia QCDR form completed.        

## 2017-09-21 NOTE — Interval H&P Note (Signed)
History and Physical Interval Note:  09/21/2017 11:47 AM  Renee Meyer  has presented today for surgery, with the diagnosis of [place]  The various methods of treatment have been discussed with the patient and family. After consideration of risks, benefits and other options for treatment, the patient has consented to  Procedure(s): COLONOSCOPY WITH PROPOFOL (N/A) as a surgical intervention .  The patient's history has been reviewed, patient examined, no change in status, stable for surgery.  I have reviewed the patient's chart and labs.  Questions were answered to the patient's satisfaction.     [place], [place]

## 2017-09-21 NOTE — Transfer of Care (Signed)
Immediate Anesthesia Transfer of Care Note  Patient: Renee Meyer  Procedure(s) Performed: COLONOSCOPY WITH PROPOFOL (N/A )  Patient Location: PACU  Anesthesia Type:General  Level of Consciousness: awake and alert   Airway & Oxygen Therapy: Patient Spontanous Breathing  Post-op Assessment: Report given to RN  Post vital signs: Reviewed and stable  Last Vitals:  Vitals:   09/21/17 1051  BP: (!) 158/73  Pulse: 79  Resp: 20  Temp: (!) 36 C  SpO2: 96%    Last Pain:  Vitals:   09/21/17 1051  TempSrc: Tympanic         Complications: No apparent anesthesia complications

## 2017-09-21 NOTE — Anesthesia Postprocedure Evaluation (Signed)
Anesthesia Post Note  Patient: Renee Meyer  Procedure(s) Performed: COLONOSCOPY WITH PROPOFOL (N/A )  Patient location during evaluation: Endoscopy Anesthesia Type: General Level of consciousness: awake and alert and oriented Pain management: pain level controlled Vital Signs Assessment: post-procedure vital signs reviewed and stable Respiratory status: spontaneous breathing, nonlabored ventilation and respiratory function stable Cardiovascular status: blood pressure returned to baseline and stable Postop Assessment: no signs of nausea or vomiting Anesthetic complications: no     Last Vitals:  Vitals:   09/21/17 1051 09/21/17 1224  BP: (!) 158/73 (!) 145/69  Pulse: 79 77  Resp: 20   Temp: (!) 36 C (!) 35.9 C  SpO2: 96% 97%    Last Pain:  Vitals:   09/21/17 1224  TempSrc: Tympanic                 Taha Dimond

## 2017-09-21 NOTE — Anesthesia Preprocedure Evaluation (Signed)
Anesthesia Evaluation  Patient identified by MRN, date of birth, ID band Patient awake    Reviewed: Allergy & Precautions, NPO status , Patient's Chart, lab work & pertinent test results  History of Anesthesia Complications Negative for: history of anesthetic complications  Airway Mallampati: III  TM Distance: >3 FB Neck ROM: Full    Dental  (+) Upper Dentures, Lower Dentures   Pulmonary neg sleep apnea, neg COPD, former smoker,    breath sounds clear to auscultation- rhonchi (-) wheezing      Cardiovascular Exercise Tolerance: Good (-) hypertension(-) CAD, (-) Past MI, (-) Cardiac Stents and (-) CABG  Rhythm:Regular Rate:Normal - Systolic murmurs and - Diastolic murmurs    Neuro/Psych negative neurological ROS  negative psych ROS   GI/Hepatic Neg liver ROS, GERD  ,  Endo/Other  negative endocrine ROSneg diabetes  Renal/GU negative Renal ROS     Musculoskeletal negative musculoskeletal ROS (+)   Abdominal (+) + obese,   Peds  Hematology  (+) anemia ,   Anesthesia Other Findings Past Medical History: No date: Anemia in her 32s: Cancer ([place])     Comment:  basal cell carcinoma 2012: Colon polyps No date: COPD (chronic obstructive pulmonary disease) ([place]) 2013: Cystitis No date: Dysrhythmia No date: GERD (gastroesophageal reflux disease) No date: Heart murmur No date: Hyperlipidemia   Reproductive/Obstetrics                             Anesthesia Physical Anesthesia Plan  ASA: II  Anesthesia Plan: General   Post-op Pain Management:    Induction: Intravenous  PONV Risk Score and Plan: 2 and Propofol infusion  Airway Management Planned: Natural Airway  Additional Equipment:   Intra-op Plan:   Post-operative Plan:   Informed Consent: I have reviewed the patients History and Physical, chart, labs and discussed the procedure including the risks, benefits and alternatives for  the proposed anesthesia with the patient or authorized representative who has indicated his/her understanding and acceptance.   Dental advisory given  Plan Discussed with: CRNA and Anesthesiologist  Anesthesia Plan Comments:         Anesthesia Quick Evaluation

## 2017-09-21 NOTE — H&P (Signed)
Outpatient short stay form Pre-procedure 09/21/2017 11:46 AM Renee Meyer K. Alice Reichert, M.D.  Primary Physician: Yevonne Pax, M.D.  Reason for visit:  Personal hx of adenomatous colon polyps.  History of present illness: Patient presents for personal hx of adenomatous colon polyps, colonoscopy today for surveillance. Patient denies abdominal pain, change in bowel habits or rectal bleeding.     Current Facility-Administered Medications:  .  0.9 %  sodium chloride infusion, , Intravenous, Continuous, [place], Benay Pike, MD, Last Rate: 20 mL/hr at 09/21/17 1106, 1,000 mL at 09/21/17 1106  Medications Prior to Admission  Medication Sig Dispense Refill Last Dose  . ferrous sulfate 325 (65 FE) MG tablet Take 325 mg by mouth daily with breakfast.   07/28/2016 at 0800  . furosemide (LASIX) 20 MG tablet Take 20 mg by mouth. 1.5 tablets per day.   07/28/2016 at 0800  . GLUCOSAMINE-CALCIUM-VIT D PO Take 1 tablet by mouth daily.   07/28/2016 at 0800  . loratadine (CLARITIN) 10 MG tablet Take 10 mg by mouth daily.   prn at prn  . magnesium oxide (MAG-OX) 400 MG tablet Take 400 mg by mouth daily.   07/28/2016 at 0800  . meclizine (ANTIVERT) 25 MG tablet Take 1 tablet by mouth as needed.    prn at prn  . metolazone (ZAROXOLYN) 2.5 MG tablet Take 2.5 mg by mouth 2 (two) times a week.   Past Week at 0800  . metoprolol succinate (TOPROL-XL) 50 MG 24 hr tablet Take 1 tablet by mouth daily.   09/19/2017  . omeprazole (PRILOSEC) 20 MG capsule Take 20 mg by mouth daily.   07/28/2016 at 0700  . potassium chloride (K-DUR,KLOR-CON) 10 MEQ tablet Take 10 mEq by mouth once.   07/28/2016 at 0800  . pravastatin (PRAVACHOL) 40 MG tablet Take 1 tablet by mouth daily.   07/29/2016 at 0800  . vitamin B-12 (CYANOCOBALAMIN) 1000 MCG tablet Take 1,000 mcg by mouth daily.   07/28/2016 at 0800  . [DISCONTINUED] oxyCODONE (OXY IR/ROXICODONE) 5 MG immediate release tablet Take 1-2 tablets (5-10 mg total) by mouth every 4 (four)  hours as needed for breakthrough pain ((for MODERATE breakthrough pain)). 40 tablet 0      Allergies  Allergen Reactions  . Amoxicillin Hives  . Bisoprolol Hives  . Clindamycin/Lincomycin Hives  . Codeine Other (See Comments)    headache  . Imdur [Isosorbide Dinitrate]   . Keflex [Cephalexin] Hives  . Penicillins Hives  . Sulfa Antibiotics Hives  . Zocor [Simvastatin] Swelling     Past Medical History:  Diagnosis Date  . Anemia   . Cancer ([place]) in her 44s   basal cell carcinoma  . Colon polyps 2012  . COPD (chronic obstructive pulmonary disease) ([place])   . Cystitis 2013  . Dysrhythmia   . GERD (gastroesophageal reflux disease)   . Heart murmur   . Hyperlipidemia     Review of systems:   Negative.   Physical Exam  General appearance: alert and cooperative Resp: clear to auscultation bilaterally Cardio: regular rate and rhythm, S1, S2 normal, no murmur, click, rub or gallop GI: soft, non-tender; bowel sounds normal; no masses,  no organomegaly Extremities: extremities normal, atraumatic, no cyanosis or edema     Planned procedures: Colonoscopy. The patient understands the nature of the planned procedure, indications, risks, alternatives and potential complications including but not limited to bleeding, infection, perforation, damage to internal organs and possible oversedation/side effects from anesthesia. The patient agrees and gives consent to proceed.  Please refer to procedure notes for findings, recommendations and patient disposition/instructions.    Dann Galicia K. Alice Reichert, M.D. Gastroenterology 09/21/2017  11:46 AM

## 2017-09-21 NOTE — Op Note (Signed)
[facility] Gastroenterology Patient Name: Renee Meyer Procedure Date: 09/21/2017 11:53 AM MRN: [id number] Account #: [id number] Date of Birth: 1941-07-17 Admit Type: Outpatient Age: 77 Room: [facility] ENDO ROOM 1 Gender: Female Note Status: Finalized Procedure:            Colonoscopy Indications:          High risk colon cancer surveillance: Personal history                        of adenoma less than 10 mm in size Providers:            Dilan Fullenwider K. Alice Reichert MD, MD Referring MD:         Rusty Aus, MD (Referring MD) Medicines:            Propofol per Anesthesia Complications:        No immediate complications. Procedure:            Pre-Anesthesia Assessment:                       - The risks and benefits of the procedure and the                        sedation options and risks were discussed with the                        patient. All questions were answered and informed                        consent was obtained.                       - Patient identification and proposed procedure were                        verified prior to the procedure by the nurse. The                        procedure was verified in the procedure room.                       - ASA Grade Assessment: III - A patient with severe                        systemic disease.                       - After reviewing the risks and benefits, the patient                        was deemed in satisfactory condition to undergo the                        procedure.                       After obtaining informed consent, the colonoscope was                        passed under direct vision. Throughout the procedure,  the patient's blood pressure, pulse, and oxygen                        saturations were monitored continuously. The                        Colonoscope was introduced through the anus and                        advanced to the the cecum, identified by appendiceal              orifice and ileocecal valve. The ileocecal valve,                        appendiceal orifice, and rectum were photographed. The                        patient tolerated the procedure well. The quality of                        the bowel preparation was good. Findings:      The perianal and digital rectal examinations were normal. Pertinent       negatives include normal sphincter tone and no palpable rectal lesions.      Two sessile polyps were found in the recto-sigmoid colon and sigmoid       colon. The polyps were 2 to 3 mm in size. These polyps were removed with       a jumbo cold forceps. Resection and retrieval were complete.      Non-bleeding internal hemorrhoids were found during retroflexion. The       hemorrhoids were Grade I (internal hemorrhoids that do not prolapse).      The exam was otherwise without abnormality. Impression:           - Two 2 to 3 mm polyps at the recto-sigmoid colon and                        in the sigmoid colon, removed with a jumbo cold                        forceps. Resected and retrieved.                       - Non-bleeding internal hemorrhoids.                       - The examination was otherwise normal. Recommendation:       - Patient has a contact number available for                        emergencies. The signs and symptoms of potential                        delayed complications were discussed with the patient.                        Return to normal activities tomorrow. Written discharge                        instructions were provided to the patient.                       -  Discharge patient to home (with escort).                       - Resume previous diet.                       - Continue present medications.                       - Await pathology results.                       - Repeat colonoscopy is recommended for surveillance.                        The colonoscopy date will be determined after pathology                         results from today's exam become available for review.                       - The findings and recommendations were discussed with                        the patient and their family.                       - Return to GI office PRN. Procedure Code(s):    --- Professional ---                       [phone], Colonoscopy, flexible; with biopsy, single or                        multiple Diagnosis Code(s):    --- Professional ---                       Z86.010, Personal history of colonic polyps                       D12.7, Benign neoplasm of rectosigmoid junction                       D12.5, Benign neoplasm of sigmoid colon                       K64.0, First degree hemorrhoids CPT copyright 2016 American [employer]. All rights reserved. The codes documented in this report are preliminary and upon coder review may  be revised to meet current compliance requirements. Efrain Sella MD, MD 09/21/2017 12:21:49 PM This report has been signed electronically. Number of Addenda: 0 Note Initiated On: 09/21/2017 11:53 AM Scope Withdrawal Time: 0 hours 8 minutes 11 seconds  Total Procedure Duration: 0 hours 17 minutes 2 seconds       [facility]

## 2017-09-23 LAB — SURGICAL PATHOLOGY

## 2017-10-04 DIAGNOSIS — J45902 Unspecified asthma with status asthmaticus: Secondary | ICD-10-CM | POA: Diagnosis not present

## 2017-10-04 DIAGNOSIS — J40 Bronchitis, not specified as acute or chronic: Secondary | ICD-10-CM | POA: Diagnosis not present

## 2017-10-25 DIAGNOSIS — R05 Cough: Secondary | ICD-10-CM | POA: Diagnosis not present

## 2017-10-25 DIAGNOSIS — J189 Pneumonia, unspecified organism: Secondary | ICD-10-CM | POA: Diagnosis not present

## 2017-12-21 DIAGNOSIS — M818 Other osteoporosis without current pathological fracture: Secondary | ICD-10-CM | POA: Diagnosis not present

## 2017-12-21 DIAGNOSIS — E782 Mixed hyperlipidemia: Secondary | ICD-10-CM | POA: Diagnosis not present

## 2017-12-21 DIAGNOSIS — E538 Deficiency of other specified B group vitamins: Secondary | ICD-10-CM | POA: Diagnosis not present

## 2017-12-23 DIAGNOSIS — Z0000 Encounter for general adult medical examination without abnormal findings: Secondary | ICD-10-CM | POA: Diagnosis not present

## 2017-12-23 DIAGNOSIS — J449 Chronic obstructive pulmonary disease, unspecified: Secondary | ICD-10-CM | POA: Diagnosis not present

## 2017-12-23 DIAGNOSIS — D500 Iron deficiency anemia secondary to blood loss (chronic): Secondary | ICD-10-CM | POA: Insufficient documentation

## 2017-12-23 DIAGNOSIS — E538 Deficiency of other specified B group vitamins: Secondary | ICD-10-CM | POA: Diagnosis not present

## 2017-12-23 DIAGNOSIS — E782 Mixed hyperlipidemia: Secondary | ICD-10-CM | POA: Diagnosis not present

## 2018-06-02 DIAGNOSIS — Z1231 Encounter for screening mammogram for malignant neoplasm of breast: Secondary | ICD-10-CM | POA: Diagnosis not present

## 2018-06-24 DIAGNOSIS — E538 Deficiency of other specified B group vitamins: Secondary | ICD-10-CM | POA: Diagnosis not present

## 2018-06-24 DIAGNOSIS — D500 Iron deficiency anemia secondary to blood loss (chronic): Secondary | ICD-10-CM | POA: Diagnosis not present

## 2018-06-24 DIAGNOSIS — J449 Chronic obstructive pulmonary disease, unspecified: Secondary | ICD-10-CM | POA: Diagnosis not present

## 2018-06-24 DIAGNOSIS — E782 Mixed hyperlipidemia: Secondary | ICD-10-CM | POA: Diagnosis not present

## 2018-06-28 DIAGNOSIS — E538 Deficiency of other specified B group vitamins: Secondary | ICD-10-CM | POA: Diagnosis not present

## 2018-06-28 DIAGNOSIS — D500 Iron deficiency anemia secondary to blood loss (chronic): Secondary | ICD-10-CM | POA: Diagnosis not present

## 2018-06-28 DIAGNOSIS — E782 Mixed hyperlipidemia: Secondary | ICD-10-CM | POA: Diagnosis not present

## 2018-06-28 DIAGNOSIS — J449 Chronic obstructive pulmonary disease, unspecified: Secondary | ICD-10-CM | POA: Diagnosis not present

## 2018-06-28 DIAGNOSIS — Z23 Encounter for immunization: Secondary | ICD-10-CM | POA: Diagnosis not present

## 2018-07-26 ENCOUNTER — Emergency Department: Payer: PPO

## 2018-07-26 ENCOUNTER — Encounter: Payer: Self-pay | Admitting: Emergency Medicine

## 2018-07-26 ENCOUNTER — Emergency Department
Admission: EM | Admit: 2018-07-26 | Discharge: 2018-07-26 | Disposition: A | Discharge status: Home / Self Care | Payer: PPO | Attending: Student in an Organized Health Care Education/Training Program | Admitting: Student in an Organized Health Care Education/Training Program

## 2018-07-26 ENCOUNTER — Other Ambulatory Visit: Payer: Self-pay

## 2018-07-26 DIAGNOSIS — R1084 Generalized abdominal pain: Secondary | ICD-10-CM | POA: Diagnosis not present

## 2018-07-26 DIAGNOSIS — Y92411 Interstate highway as the place of occurrence of the external cause: Secondary | ICD-10-CM | POA: Diagnosis not present

## 2018-07-26 DIAGNOSIS — R1013 Epigastric pain: Secondary | ICD-10-CM | POA: Insufficient documentation

## 2018-07-26 DIAGNOSIS — M542 Cervicalgia: Secondary | ICD-10-CM | POA: Diagnosis not present

## 2018-07-26 DIAGNOSIS — J449 Chronic obstructive pulmonary disease, unspecified: Secondary | ICD-10-CM | POA: Diagnosis not present

## 2018-07-26 DIAGNOSIS — Y999 Unspecified external cause status: Secondary | ICD-10-CM | POA: Diagnosis not present

## 2018-07-26 DIAGNOSIS — M25511 Pain in right shoulder: Secondary | ICD-10-CM | POA: Diagnosis not present

## 2018-07-26 DIAGNOSIS — Z85828 Personal history of other malignant neoplasm of skin: Secondary | ICD-10-CM | POA: Diagnosis not present

## 2018-07-26 DIAGNOSIS — Y9389 Activity, other specified: Secondary | ICD-10-CM | POA: Diagnosis not present

## 2018-07-26 DIAGNOSIS — S0990XA Unspecified injury of head, initial encounter: Secondary | ICD-10-CM | POA: Insufficient documentation

## 2018-07-26 DIAGNOSIS — S3991XA Unspecified injury of abdomen, initial encounter: Secondary | ICD-10-CM | POA: Diagnosis not present

## 2018-07-26 DIAGNOSIS — R0789 Other chest pain: Secondary | ICD-10-CM | POA: Diagnosis not present

## 2018-07-26 DIAGNOSIS — S199XXA Unspecified injury of neck, initial encounter: Secondary | ICD-10-CM | POA: Diagnosis not present

## 2018-07-26 DIAGNOSIS — R1030 Lower abdominal pain, unspecified: Secondary | ICD-10-CM | POA: Diagnosis not present

## 2018-07-26 DIAGNOSIS — Z87891 Personal history of nicotine dependence: Secondary | ICD-10-CM | POA: Diagnosis not present

## 2018-07-26 DIAGNOSIS — M25562 Pain in left knee: Secondary | ICD-10-CM | POA: Insufficient documentation

## 2018-07-26 DIAGNOSIS — S8992XA Unspecified injury of left lower leg, initial encounter: Secondary | ICD-10-CM | POA: Diagnosis not present

## 2018-07-26 DIAGNOSIS — S299XXA Unspecified injury of thorax, initial encounter: Secondary | ICD-10-CM | POA: Diagnosis not present

## 2018-07-26 DIAGNOSIS — R109 Unspecified abdominal pain: Secondary | ICD-10-CM | POA: Diagnosis not present

## 2018-07-26 DIAGNOSIS — R079 Chest pain, unspecified: Secondary | ICD-10-CM | POA: Diagnosis not present

## 2018-07-26 DIAGNOSIS — Z79899 Other long term (current) drug therapy: Secondary | ICD-10-CM | POA: Diagnosis not present

## 2018-07-26 LAB — CBC
HCT: 37.6 % (ref 36.0–46.0)
Hemoglobin: 11.7 g/dL — ABNORMAL LOW (ref 12.0–15.0)
MCH: 28.5 pg (ref 26.0–34.0)
MCHC: 31.1 g/dL (ref 30.0–36.0)
MCV: 91.7 fL (ref 80.0–100.0)
PLATELETS: 206 10*3/uL (ref 150–400)
RBC: 4.1 MIL/uL (ref 3.87–5.11)
RDW: 13.3 % (ref 11.5–15.5)
WBC: 10.4 10*3/uL (ref 4.0–10.5)
nRBC: 0 % (ref 0.0–0.2)

## 2018-07-26 LAB — TROPONIN I

## 2018-07-26 LAB — BASIC METABOLIC PANEL
Anion gap: 7 (ref 5–15)
BUN: 18 mg/dL (ref 8–23)
CO2: 25 mmol/L (ref 22–32)
Calcium: 9.3 mg/dL (ref 8.9–10.3)
Chloride: 107 mmol/L (ref 98–111)
Creatinine, Ser: 1.14 mg/dL — ABNORMAL HIGH (ref 0.44–1.00)
GFR calc Af Amer: 54 mL/min — ABNORMAL LOW (ref >60)
GFR, EST NON AFRICAN AMERICAN: 47 mL/min — AB (ref >60)
Glucose, Bld: 124 mg/dL — ABNORMAL HIGH (ref 70–99)
Potassium: 3.7 mmol/L (ref 3.5–5.1)
Sodium: 139 mmol/L (ref 135–145)

## 2018-07-26 MED ORDER — FENTANYL CITRATE (PF) 100 MCG/2ML IJ SOLN
50.0000 ug | INTRAMUSCULAR | Status: DC | PRN
  Administered 2018-07-26: 50 ug via INTRAVENOUS
  Filled 2018-07-26: qty 2

## 2018-07-26 MED ORDER — IOPAMIDOL (ISOVUE-300) INJECTION 61%
100.0000 mL | Freq: Once | INTRAVENOUS | Status: AC | PRN
  Administered 2018-07-26: 100 mL via INTRAVENOUS
  Filled 2018-07-26: qty 100

## 2018-07-26 MED ORDER — HYDROCODONE-ACETAMINOPHEN 7.5-325 MG/15ML PO SOLN: 5.0000 mL | Freq: Four times a day (QID) | ORAL | 0 refills | Status: AC | PRN

## 2018-07-26 MED ORDER — LIDOCAINE 5 % EX PTCH: 1.0000 | MEDICATED_PATCH | Freq: Two times a day (BID) | CUTANEOUS | 0 refills | Status: AC
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
## 2018-07-26 NOTE — ED Notes (Signed)
Pt ambulated to the restroom with steady gait. Is cautious and moving slowly but tolerated well. Pt given a soda and crackers with peanut butter to have once she returns. verbalized understanding.
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
## 2018-07-26 NOTE — ED Notes (Signed)
Patient transported to CT 

## 2018-07-26 NOTE — ED Notes (Signed)
Patient transported to X-ray 

## 2018-07-26 NOTE — ED Notes (Signed)
Patient returned to the waiting room at this time from x-ray.

## 2018-07-26 NOTE — Discharge Instructions (Signed)
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
## 2018-08-26 DIAGNOSIS — L237 Allergic contact dermatitis due to plants, except food: Secondary | ICD-10-CM | POA: Diagnosis not present

## 2018-09-08 DIAGNOSIS — L255 Unspecified contact dermatitis due to plants, except food: Secondary | ICD-10-CM | POA: Diagnosis not present

## 2018-09-08 DIAGNOSIS — J069 Acute upper respiratory infection, unspecified: Secondary | ICD-10-CM | POA: Diagnosis not present

## 2018-12-28 DIAGNOSIS — E782 Mixed hyperlipidemia: Secondary | ICD-10-CM | POA: Diagnosis not present

## 2018-12-28 DIAGNOSIS — E538 Deficiency of other specified B group vitamins: Secondary | ICD-10-CM | POA: Diagnosis not present

## 2018-12-28 DIAGNOSIS — D500 Iron deficiency anemia secondary to blood loss (chronic): Secondary | ICD-10-CM | POA: Diagnosis not present

## 2019-01-04 DIAGNOSIS — J449 Chronic obstructive pulmonary disease, unspecified: Secondary | ICD-10-CM | POA: Diagnosis not present

## 2019-01-04 DIAGNOSIS — E782 Mixed hyperlipidemia: Secondary | ICD-10-CM | POA: Diagnosis not present

## 2019-01-04 DIAGNOSIS — Z0000 Encounter for general adult medical examination without abnormal findings: Secondary | ICD-10-CM | POA: Diagnosis not present

## 2019-01-04 DIAGNOSIS — E538 Deficiency of other specified B group vitamins: Secondary | ICD-10-CM | POA: Diagnosis not present

## 2019-01-04 DIAGNOSIS — I2781 Cor pulmonale (chronic): Secondary | ICD-10-CM | POA: Insufficient documentation

## 2019-01-04 DIAGNOSIS — D500 Iron deficiency anemia secondary to blood loss (chronic): Secondary | ICD-10-CM | POA: Diagnosis not present

## 2019-03-13 DIAGNOSIS — M5414 Radiculopathy, thoracic region: Secondary | ICD-10-CM | POA: Diagnosis not present

## 2019-03-16 DIAGNOSIS — B029 Zoster without complications: Secondary | ICD-10-CM | POA: Diagnosis not present

## 2019-06-28 DIAGNOSIS — E782 Mixed hyperlipidemia: Secondary | ICD-10-CM | POA: Diagnosis not present

## 2019-06-28 DIAGNOSIS — D500 Iron deficiency anemia secondary to blood loss (chronic): Secondary | ICD-10-CM | POA: Diagnosis not present

## 2019-06-28 DIAGNOSIS — E538 Deficiency of other specified B group vitamins: Secondary | ICD-10-CM | POA: Diagnosis not present

## 2019-07-05 DIAGNOSIS — E782 Mixed hyperlipidemia: Secondary | ICD-10-CM | POA: Diagnosis not present

## 2019-07-05 DIAGNOSIS — E538 Deficiency of other specified B group vitamins: Secondary | ICD-10-CM | POA: Diagnosis not present

## 2019-07-05 DIAGNOSIS — J449 Chronic obstructive pulmonary disease, unspecified: Secondary | ICD-10-CM | POA: Diagnosis not present

## 2019-07-05 DIAGNOSIS — D500 Iron deficiency anemia secondary to blood loss (chronic): Secondary | ICD-10-CM | POA: Diagnosis not present

## 2019-10-25 DIAGNOSIS — M65341 Trigger finger, right ring finger: Secondary | ICD-10-CM | POA: Diagnosis not present

## 2019-10-25 DIAGNOSIS — I493 Ventricular premature depolarization: Secondary | ICD-10-CM | POA: Diagnosis not present

## 2019-11-06 DIAGNOSIS — M65331 Trigger finger, right middle finger: Secondary | ICD-10-CM | POA: Diagnosis not present

## 2020-01-16 DIAGNOSIS — E538 Deficiency of other specified B group vitamins: Secondary | ICD-10-CM | POA: Diagnosis not present

## 2020-01-16 DIAGNOSIS — D500 Iron deficiency anemia secondary to blood loss (chronic): Secondary | ICD-10-CM | POA: Diagnosis not present

## 2020-01-16 DIAGNOSIS — E782 Mixed hyperlipidemia: Secondary | ICD-10-CM | POA: Diagnosis not present

## 2020-01-23 DIAGNOSIS — D500 Iron deficiency anemia secondary to blood loss (chronic): Secondary | ICD-10-CM | POA: Diagnosis not present

## 2020-01-23 DIAGNOSIS — Z0000 Encounter for general adult medical examination without abnormal findings: Secondary | ICD-10-CM | POA: Diagnosis not present

## 2020-01-23 DIAGNOSIS — E782 Mixed hyperlipidemia: Secondary | ICD-10-CM | POA: Diagnosis not present

## 2020-01-23 DIAGNOSIS — E538 Deficiency of other specified B group vitamins: Secondary | ICD-10-CM | POA: Diagnosis not present

## 2020-03-03 DIAGNOSIS — B9689 Other specified bacterial agents as the cause of diseases classified elsewhere: Secondary | ICD-10-CM | POA: Diagnosis not present

## 2020-03-03 DIAGNOSIS — J209 Acute bronchitis, unspecified: Secondary | ICD-10-CM | POA: Diagnosis not present

## 2020-03-03 DIAGNOSIS — Z03818 Encounter for observation for suspected exposure to other biological agents ruled out: Secondary | ICD-10-CM | POA: Diagnosis not present

## 2020-03-03 DIAGNOSIS — J019 Acute sinusitis, unspecified: Secondary | ICD-10-CM | POA: Diagnosis not present

## 2020-03-08 DIAGNOSIS — J40 Bronchitis, not specified as acute or chronic: Secondary | ICD-10-CM | POA: Diagnosis not present

## 2020-03-08 DIAGNOSIS — J209 Acute bronchitis, unspecified: Secondary | ICD-10-CM | POA: Diagnosis not present

## 2020-03-08 DIAGNOSIS — R05 Cough: Secondary | ICD-10-CM | POA: Diagnosis not present

## 2020-03-08 DIAGNOSIS — R0602 Shortness of breath: Secondary | ICD-10-CM | POA: Diagnosis not present

## 2020-04-15 DIAGNOSIS — R21 Rash and other nonspecific skin eruption: Secondary | ICD-10-CM | POA: Diagnosis not present

## 2020-05-02 ENCOUNTER — Other Ambulatory Visit: Payer: Self-pay | Admitting: Physician Assistant

## 2020-05-02 ENCOUNTER — Ambulatory Visit: Payer: PPO

## 2020-05-02 DIAGNOSIS — R001 Bradycardia, unspecified: Secondary | ICD-10-CM | POA: Diagnosis not present

## 2020-05-02 DIAGNOSIS — Z23 Encounter for immunization: Secondary | ICD-10-CM | POA: Diagnosis not present

## 2020-05-02 DIAGNOSIS — J449 Chronic obstructive pulmonary disease, unspecified: Secondary | ICD-10-CM | POA: Diagnosis not present

## 2020-05-02 DIAGNOSIS — R600 Localized edema: Secondary | ICD-10-CM | POA: Diagnosis not present

## 2020-05-02 DIAGNOSIS — L03116 Cellulitis of left lower limb: Secondary | ICD-10-CM | POA: Diagnosis not present

## 2020-05-03 ENCOUNTER — Other Ambulatory Visit: Payer: Self-pay

## 2020-05-03 ENCOUNTER — Ambulatory Visit
Admission: RE | Admit: 2020-05-03 | Discharge: 2020-05-03 | Disposition: A | Discharge status: Home / Self Care | Payer: PPO | Source: Ambulatory Visit | Attending: Physician Assistant | Admitting: Physician Assistant

## 2020-05-03 DIAGNOSIS — R600 Localized edema: Secondary | ICD-10-CM | POA: Diagnosis not present

## 2020-05-30 DIAGNOSIS — R609 Edema, unspecified: Secondary | ICD-10-CM | POA: Diagnosis not present

## 2020-05-30 DIAGNOSIS — Z23 Encounter for immunization: Secondary | ICD-10-CM | POA: Diagnosis not present

## 2020-05-30 DIAGNOSIS — L0390 Cellulitis, unspecified: Secondary | ICD-10-CM | POA: Diagnosis not present

## 2020-06-07 DIAGNOSIS — R609 Edema, unspecified: Secondary | ICD-10-CM | POA: Diagnosis not present

## 2020-06-07 DIAGNOSIS — I872 Venous insufficiency (chronic) (peripheral): Secondary | ICD-10-CM | POA: Diagnosis not present

## 2020-06-07 DIAGNOSIS — L0390 Cellulitis, unspecified: Secondary | ICD-10-CM | POA: Diagnosis not present

## 2020-07-17 DIAGNOSIS — E538 Deficiency of other specified B group vitamins: Secondary | ICD-10-CM | POA: Diagnosis not present

## 2020-07-17 DIAGNOSIS — D500 Iron deficiency anemia secondary to blood loss (chronic): Secondary | ICD-10-CM | POA: Diagnosis not present

## 2020-07-17 DIAGNOSIS — E782 Mixed hyperlipidemia: Secondary | ICD-10-CM | POA: Diagnosis not present

## 2020-07-17 DIAGNOSIS — R7309 Other abnormal glucose: Secondary | ICD-10-CM | POA: Diagnosis not present

## 2020-07-24 DIAGNOSIS — E538 Deficiency of other specified B group vitamins: Secondary | ICD-10-CM | POA: Diagnosis not present

## 2020-07-24 DIAGNOSIS — J449 Chronic obstructive pulmonary disease, unspecified: Secondary | ICD-10-CM | POA: Diagnosis not present

## 2020-07-24 DIAGNOSIS — E782 Mixed hyperlipidemia: Secondary | ICD-10-CM | POA: Diagnosis not present

## 2020-07-24 DIAGNOSIS — I2781 Cor pulmonale (chronic): Secondary | ICD-10-CM | POA: Diagnosis not present

## 2020-07-24 DIAGNOSIS — D500 Iron deficiency anemia secondary to blood loss (chronic): Secondary | ICD-10-CM | POA: Diagnosis not present

## 2020-11-20 DIAGNOSIS — L0390 Cellulitis, unspecified: Secondary | ICD-10-CM | POA: Diagnosis not present

## 2020-12-10 DIAGNOSIS — L0390 Cellulitis, unspecified: Secondary | ICD-10-CM | POA: Diagnosis not present

## 2020-12-11 DIAGNOSIS — T8132XA Disruption of internal operation (surgical) wound, not elsewhere classified, initial encounter: Secondary | ICD-10-CM | POA: Diagnosis not present

## 2020-12-11 DIAGNOSIS — S8291XA Unspecified fracture of right lower leg, initial encounter for closed fracture: Secondary | ICD-10-CM | POA: Diagnosis not present

## 2020-12-18 ENCOUNTER — Other Ambulatory Visit (HOSPITAL_COMMUNITY): Payer: Self-pay | Admitting: Orthopedic Surgery

## 2020-12-18 ENCOUNTER — Other Ambulatory Visit: Payer: Self-pay | Admitting: Orthopedic Surgery

## 2020-12-18 DIAGNOSIS — T8132XA Disruption of internal operation (surgical) wound, not elsewhere classified, initial encounter: Secondary | ICD-10-CM

## 2020-12-18 DIAGNOSIS — T8130XA Disruption of wound, unspecified, initial encounter: Secondary | ICD-10-CM

## 2020-12-26 ENCOUNTER — Ambulatory Visit
Admission: RE | Admit: 2020-12-26 | Discharge: 2020-12-26 | Disposition: A | Discharge status: Home / Self Care | Payer: PPO | Source: Ambulatory Visit | Attending: Orthopedic Surgery | Admitting: Orthopedic Surgery

## 2020-12-26 ENCOUNTER — Other Ambulatory Visit: Payer: Self-pay

## 2020-12-26 DIAGNOSIS — T8132XA Disruption of internal operation (surgical) wound, not elsewhere classified, initial encounter: Secondary | ICD-10-CM | POA: Diagnosis not present

## 2020-12-26 DIAGNOSIS — S91001A Unspecified open wound, right ankle, initial encounter: Secondary | ICD-10-CM | POA: Diagnosis not present

## 2020-12-26 DIAGNOSIS — M7989 Other specified soft tissue disorders: Secondary | ICD-10-CM | POA: Diagnosis not present

## 2020-12-26 DIAGNOSIS — T8130XA Disruption of wound, unspecified, initial encounter: Secondary | ICD-10-CM | POA: Insufficient documentation

## 2020-12-26 DIAGNOSIS — M958 Other specified acquired deformities of musculoskeletal system: Secondary | ICD-10-CM | POA: Diagnosis not present

## 2020-12-26 DIAGNOSIS — M19171 Post-traumatic osteoarthritis, right ankle and foot: Secondary | ICD-10-CM | POA: Diagnosis not present

## 2020-12-30 DIAGNOSIS — T8132XA Disruption of internal operation (surgical) wound, not elsewhere classified, initial encounter: Secondary | ICD-10-CM | POA: Diagnosis not present

## 2021-01-03 ENCOUNTER — Ambulatory Visit (INDEPENDENT_AMBULATORY_CARE_PROVIDER_SITE_OTHER): Payer: PPO | Admitting: Vascular Surgery

## 2021-01-03 ENCOUNTER — Other Ambulatory Visit: Payer: Self-pay

## 2021-01-03 ENCOUNTER — Encounter (INDEPENDENT_AMBULATORY_CARE_PROVIDER_SITE_OTHER): Payer: Self-pay | Admitting: Vascular Surgery

## 2021-01-03 VITALS — BP 168/77 | HR 69 | Resp 16 | Ht 64.0 in | Wt 220.0 lb

## 2021-01-03 DIAGNOSIS — S82891S Other fracture of right lower leg, sequela: Secondary | ICD-10-CM

## 2021-01-03 DIAGNOSIS — E782 Mixed hyperlipidemia: Secondary | ICD-10-CM | POA: Diagnosis not present

## 2021-01-03 DIAGNOSIS — L97311 Non-pressure chronic ulcer of right ankle limited to breakdown of skin: Secondary | ICD-10-CM | POA: Insufficient documentation

## 2021-01-03 DIAGNOSIS — M7989 Other specified soft tissue disorders: Secondary | ICD-10-CM | POA: Diagnosis not present

## 2021-01-03 NOTE — Assessment & Plan Note (Signed)
lipid control important in reducing the progression of atherosclerotic disease. Continue statin therapy  

## 2021-01-03 NOTE — Assessment & Plan Note (Signed)
Once we evaluate her perfusion, we can get her in Unna boots which will help get the swelling under control as well as heal the wound.  Once the wound is healed, compression socks going forward would be helpful to keep her swelling under control.  She may also benefit from a lymphedema pump but we will wait until we evaluate her arterial and venous perfusion before considering that.

## 2021-01-03 NOTE — Progress Notes (Signed)
Patient ID: Renee Meyer, female   DOB: 09/27/1940, 80 y.o.   MRN: [id number]  Chief Complaint  Patient presents with  . New Patient (Initial Visit)    Ref Mack Guise disruption of surgical wound    HPI Renee Meyer is a 80 y.o. female.  I am asked to see the patient by Dr. Mack Guise for evaluation of a non-healing ulcer of the right lateral ankle/lower leg.  The patient had an ankle fracture repair 4 to 5 years ago and has had a chronic difficulty heel ulcer on the right lateral ankle and lower leg area since that time.  She has chronic swelling in both legs but worse on the right leg.  She has been previously diagnosed with lymphedema.  The area is chronically sore but not that painful to her.  Her swelling waxes and wanes but she always has some swelling in that right leg.  No current fevers or chills.  She has undergone a CT scan of the lower leg which I have reviewed that does not show abscess or drainable fluid collection.  This does not have contrast to evaluate the blood vessels. Her orthopedic surgery is planning to remove some of the hardware due to the possibility of chronic infection.  Dr. Mack Guise and  I have discussed the case and given her prominent swelling and poorly healing wound, vascular compromise needs to be assessed before she has any further surgery.     Past Medical History:  Diagnosis Date  . Anemia   . Cancer ([place]) in her 68s   basal cell carcinoma  . Colon polyps 2012  . COPD (chronic obstructive pulmonary disease) ([place])   . Cystitis 2013  . Dysrhythmia   . GERD (gastroesophageal reflux disease)   . Heart murmur   . Hyperlipidemia     Past Surgical History:  Procedure Laterality Date  . ABDOMINAL HYSTERECTOMY    . 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  Marland Kitchen CESAREAN SECTION    . COLONOSCOPY  2012   Dr Vira Agar  . COLONOSCOPY WITH PROPOFOL N/A 09/21/2017   Procedure: COLONOSCOPY WITH PROPOFOL;  Surgeon:  Toledo, Benay Pike, MD;  Location: [facility] ENDOSCOPY;  Service: Gastroenterology;  Laterality: N/A;  . FRACTURE SURGERY Left 2013   arm  . ORIF ANKLE FRACTURE Right 08/06/2016   Procedure: OPEN REDUCTION INTERNAL FIXATION (ORIF) ANKLE FRACTURE;  Surgeon: Thornton Park, MD;  Location: [facility] ORS;  Service: Orthopedics;  Laterality: Right;  SMALL FRAGMENT SET SYNTHES 4.0MM CANNULATED SCREW SET        Family History  Problem Relation Age of Onset  . Colon cancer Sister   no bleeding or clotting disorders No aneurysms   Social History   Tobacco Use  . Smoking status: Former Smoker    Quit date: 08/05/1996    Years since quitting: 24.4  . Smokeless tobacco: Never Used  Substance Use Topics  . Alcohol use: No  . Drug use: No    Allergies  Allergen Reactions  . Amoxicillin Hives  . Bisoprolol Hives  . Clindamycin/Lincomycin Hives  . Codeine Other (See Comments)    headache  . Imdur [Isosorbide Dinitrate]   . Keflex [Cephalexin] Hives  . Penicillins Hives  . Sulfa Antibiotics Hives  . Zocor [Simvastatin] Swelling    Current Outpatient Medications  Medication Sig Dispense Refill  . Calcium Carbonate+Vitamin D (CALCIUM 600 + D) 600-200 MG-UNIT TABS Take by mouth.    . Cholecalciferol (D3  DOTS) 50 MCG (2000 UT) TBDP Take by mouth.    . ferrous sulfate 325 (65 FE) MG tablet Take 325 mg by mouth daily with breakfast.    . furosemide (LASIX) 20 MG tablet Take 20 mg by mouth. 1.5 tablets per day.    Marland Kitchen GLUCOSAMINE-CALCIUM-VIT D PO Take 1 tablet by mouth daily.    Marland Kitchen loratadine (CLARITIN) 10 MG tablet Take 10 mg by mouth daily.    . magnesium oxide (MAG-OX) 400 MG tablet Take 400 mg by mouth daily.    . meclizine (ANTIVERT) 25 MG tablet Take 1 tablet by mouth as needed.     . metolazone (ZAROXOLYN) 2.5 MG tablet Take 2.5 mg by mouth 2 (two) times a week.    . metoprolol succinate (TOPROL-XL) 50 MG 24 hr tablet Take 1 tablet by mouth daily.    Marland Kitchen omeprazole (PRILOSEC) 20 MG capsule  Take 20 mg by mouth daily.    . potassium chloride (K-DUR,KLOR-CON) 10 MEQ tablet Take 10 mEq by mouth once.    . pravastatin (PRAVACHOL) 40 MG tablet Take 1 tablet by mouth daily.    . vitamin B-12 (CYANOCOBALAMIN) 1000 MCG tablet Take 1,000 mcg by mouth daily.     No current facility-administered medications for this visit.      REVIEW OF SYSTEMS (Negative unless checked)  Constitutional: [] Weight loss  [] Fever  [] Chills Cardiac: [] Chest pain   [] Chest pressure   [] Palpitations   [] Shortness of breath when laying flat   [] Shortness of breath at rest   [] Shortness of breath with exertion. Vascular:  [] Pain in legs with walking   [] Pain in legs at rest   [] Pain in legs when laying flat   [] Claudication   [] Pain in feet when walking  [] Pain in feet at rest  [] Pain in feet when laying flat   [] History of DVT   [] Phlebitis   [x] Swelling in legs   [] Varicose veins   [x] Non-healing ulcers Pulmonary:   [] Uses home oxygen   [] Productive cough   [] Hemoptysis   [] Wheeze  [x] COPD   [] Asthma Neurologic:  [] Dizziness  [] Blackouts   [] Seizures   [] History of stroke   [] History of TIA  [] Aphasia   [] Temporary blindness   [] Dysphagia   [] Weakness or numbness in arms   [] Weakness or numbness in legs Musculoskeletal:  [x] Arthritis   [] Joint swelling   [x] Joint pain   [] Low back pain Hematologic:  [] Easy bruising  [] Easy bleeding   [] Hypercoagulable state   [x] Anemic  [] Hepatitis Gastrointestinal:  [] Blood in stool   [] Vomiting blood  [] Gastroesophageal reflux/heartburn   [] Abdominal pain Genitourinary:  [] Chronic kidney disease   [] Difficult urination  [] Frequent urination  [] Burning with urination   [] Hematuria Skin:  [] Rashes   [x] Ulcers   [x] Wounds Psychological:  [] History of anxiety   []  History of major depression.    Physical Exam BP (!) 168/77 (BP Location: Left Arm)   Pulse 69   Resp 16   Ht 5\' 4"  (1.626 m)   Wt 220 lb (99.8 kg)   BMI 37.76 kg/m  Gen:  WD/WN, NAD. Appears younger than  stated age. Head: [place]/AT, No temporalis wasting.  Ear/Nose/Throat: Hearing grossly intact, nares w/o erythema or drainage, oropharynx w/o Erythema/Exudate Eyes: Conjunctiva clear, sclera non-icteric  Neck: trachea midline.  No JVD.  Pulmonary:  Good air movement, respirations not labored, no use of accessory muscles  Cardiac: RRR, no JVD Vascular:  Vessel Right Left  Radial Palpable Palpable  DP 1+ 1+  PT NP 1+   Gastrointestinal:. No masses, surgical incisions, or scars. Musculoskeletal: M/S 5/5 throughout.  Extremities without ischemic changes.  No deformity or atrophy.  Small chronic wound on the right lateral ankle and distal lower leg.  Moderate to severe stasis dermatitis changes are present in that right leg with mild to moderate changes on the left.  No purulence is identified.  It is not particularly tender to the touch.  2+ right lower extremity edema, 1+ left lower extremity edema. Neurologic: Sensation grossly intact in extremities.  Symmetrical.  Speech is fluent. Motor exam as listed above. Psychiatric: Judgment intact, Mood & affect appropriate for pt's clinical situation. Dermatologic: Small wound on the right lateral ankle area as described above    Radiology CT ANKLE RIGHT WO CONTRAST  Result Date: 12/27/2020 CLINICAL DATA:  Chronic wound since surgery to right ankle several years ago, possible need for hardware removal. EXAM: CT OF THE RIGHT ANKLE WITHOUT CONTRAST TECHNIQUE: Multidetector CT imaging of the right ankle was performed according to the standard protocol. Multiplanar CT image reconstructions were also generated. COMPARISON:  Ankle radiograph 08/06/2016. FINDINGS: Bones/Joint/Cartilage Prior bimalleolar fracture fixation. Healed appearance of the fractures with near anatomic alignment. Hardware is intact without any lucency along the screws, however one of the medial malleolar screws appears backed out by a few mm as well as the most  inferior lateral malleolar screw. There is callus formation, chronic periosteal change, and sclerosis of the lateral malleolus likely healing changes. There is no acute osseous abnormality. 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 There are osteochondral defects of the anterior aspect of the lateral talar dome (coronal bone image 43 and 46). There is a linear bone fragment adjacent to the more lateral defect which may be a loose fragment (coronal image 41). There is moderate degenerative arthritis of the middle subtalar facet. Ligaments Suboptimally assessed by CT. The ATFL appears thickened, likely chronically sprain. Muscles and Tendons No significant muscle atrophy.  There is no tendon entrapment. Soft tissues There is extensive soft tissue swelling of the ankle superficially. There is no focal fluid collection identified. IMPRESSION: Prior medial malleolar and distal fibular fixation with healed appearance. Slight backing out of the more posterior medial malleolar screw and of the most inferior lateral malleolar screw although there is no significant perihardware lucency to suggest ongoing loosening. Extensive soft tissue swelling and skin thickening along the ankle, without evidence of soft tissue abscess. Chronic periosteal change, callus formation, and sclerosis along the lateral malleolus which is favored to be healed appearance of the prior fracture, however chronic healed or healing osteomyelitis cannot be excluded. No evidence of bony erosion/destruction. 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 Osteochondral defects along the lateral talar dome each measuring 4 mm, which likely loose osteochondral fragment adjacent to the more lateral defect. Electronically Signed   By: Maurine Simmering   On: 12/27/2020 10:27    Labs No results found for this or any previous visit  (from the past 2160 hour(s)).  Assessment/Plan:  Lower limb ulcer, ankle, right, limited to breakdown of skin ([place]) Dr. Mack Guise and  I have discussed the case and given her prominent swelling and poorly healing wound, vascular compromise needs to be assessed before she has any further surgery.  We will get ABIs to evaluate her arterial perfusion in the near future at her convenience.  This the more critical of  the 2 studies.  We will also get a venous study as the location and appearance of her leg would be consistent with significant venous insufficiency.  This may be more of a lymphedema issue, and if arterial and venous studies are unrevealing, I would recommend Unna boots to help try to get her swelling under control and heal the wound.  We will hold off on these until we evaluate her perfusion.  Mixed hyperlipidemia lipid control important in reducing the progression of atherosclerotic disease. Continue statin therapy   Fracture of ankle Status post repair 4 to 5 years ago.  Orthopedics is seeing patient.  Hardware removal is planned for the near future after we evaluate her perfusion.  Swelling of limb Once we evaluate her perfusion, we can get her in Unna boots which will help get the swelling under control as well as heal the wound.  Once the wound is healed, compression socks going forward would be helpful to keep her swelling under control.  She may also benefit from a lymphedema pump but we will wait until we evaluate her arterial and venous perfusion before considering that.      Leotis Pain 01/03/2021, 12:22 PM   This note was created with Dragon medical transcription system.  Any errors from dictation are unintentional.

## 2021-01-03 NOTE — Assessment & Plan Note (Signed)
Dr. Mack Guise and  I have discussed the case and given her prominent swelling and poorly healing wound, vascular compromise needs to be assessed before she has any further surgery.  We will get ABIs to evaluate her arterial perfusion in the near future at her convenience.  This the more critical of the 2 studies.  We will also get a venous study as the location and appearance of her leg would be consistent with significant venous insufficiency.  This may be more of a lymphedema issue, and if arterial and venous studies are unrevealing, I would recommend Unna boots to help try to get her swelling under control and heal the wound.  We will hold off on these until we evaluate her perfusion.

## 2021-01-03 NOTE — Assessment & Plan Note (Signed)
Status post repair 4 to 5 years ago.  Orthopedics is seeing patient.  Hardware removal is planned for the near future after we evaluate her perfusion.

## 2021-01-08 ENCOUNTER — Encounter (INDEPENDENT_AMBULATORY_CARE_PROVIDER_SITE_OTHER): Payer: Self-pay | Admitting: Nurse Practitioner

## 2021-01-08 ENCOUNTER — Ambulatory Visit (INDEPENDENT_AMBULATORY_CARE_PROVIDER_SITE_OTHER): Payer: PPO

## 2021-01-08 ENCOUNTER — Other Ambulatory Visit: Payer: Self-pay

## 2021-01-08 ENCOUNTER — Ambulatory Visit (INDEPENDENT_AMBULATORY_CARE_PROVIDER_SITE_OTHER): Payer: PPO | Admitting: Nurse Practitioner

## 2021-01-08 VITALS — BP 191/82 | HR 60 | Resp 16 | Wt 218.0 lb

## 2021-01-08 DIAGNOSIS — J449 Chronic obstructive pulmonary disease, unspecified: Secondary | ICD-10-CM | POA: Diagnosis not present

## 2021-01-08 DIAGNOSIS — K219 Gastro-esophageal reflux disease without esophagitis: Secondary | ICD-10-CM

## 2021-01-08 DIAGNOSIS — M7989 Other specified soft tissue disorders: Secondary | ICD-10-CM

## 2021-01-08 DIAGNOSIS — L97311 Non-pressure chronic ulcer of right ankle limited to breakdown of skin: Secondary | ICD-10-CM

## 2021-01-08 DIAGNOSIS — E782 Mixed hyperlipidemia: Secondary | ICD-10-CM | POA: Diagnosis not present

## 2021-01-15 ENCOUNTER — Other Ambulatory Visit: Payer: Self-pay

## 2021-01-15 ENCOUNTER — Encounter (INDEPENDENT_AMBULATORY_CARE_PROVIDER_SITE_OTHER): Payer: Self-pay

## 2021-01-15 ENCOUNTER — Ambulatory Visit (INDEPENDENT_AMBULATORY_CARE_PROVIDER_SITE_OTHER): Payer: PPO | Admitting: Nurse Practitioner

## 2021-01-15 VITALS — BP 179/67 | HR 82 | Resp 16 | Wt 218.0 lb

## 2021-01-15 DIAGNOSIS — L97311 Non-pressure chronic ulcer of right ankle limited to breakdown of skin: Secondary | ICD-10-CM | POA: Diagnosis not present

## 2021-01-15 NOTE — Progress Notes (Signed)
History of Present Illness  There is no documented history at this time  Assessments & Plan   There are no diagnoses linked to this encounter.    Additional instructions  Subjective:  Patient presents with venous ulcer of the Right lower extremity.    Procedure:  3 layer unna wrap was placed Right lower extremity.   Plan:   Follow up in one week.   

## 2021-01-17 DIAGNOSIS — S8291XA Unspecified fracture of right lower leg, initial encounter for closed fracture: Secondary | ICD-10-CM | POA: Diagnosis not present

## 2021-01-18 ENCOUNTER — Encounter (INDEPENDENT_AMBULATORY_CARE_PROVIDER_SITE_OTHER): Payer: Self-pay | Admitting: Nurse Practitioner

## 2021-01-18 NOTE — Progress Notes (Signed)
Subjective:    Patient ID: Renee Meyer, female    DOB: Aug 14, 1941, 80 y.o.   MRN: [id number] Chief Complaint  Patient presents with  . Follow-up    Ultrasound follow up    Renee Meyer is a 80 y.o. female.  The patient returns today for follow-up evaluation of a nonhealing ulcer of the right lateral lower extremity.  She had a broken ankle approximately 4 to 5 years ago with a repair.  Unfortunately ever since that time she has had a chronic ankle ulcer in that area.  She also has swelling bilaterally but it is worse in the right lower extremity.  She notes that the area will seem to heal for short period of time and then suddenly she begins to have another ulceration.  She denies any fevers or chills.  Previous CT scan shows no abscess.  There is discussion of possibly removing some of her orthopedic hardware due to concern for possible infection causing the recurrent ulceration.  In addition to assessing for possible causes of ulceration, vascular assessment is also being done for planning of a possible upcoming surgery.   Today noninvasive studies show evidence of deep venous insufficiency in the right popliteal vein.  There is no evidence of DVT or superficial venous reflux in the right lower extremity.  The right ABI is 1.02 with the left being 1.04.  The patient has triphasic tibial artery waveforms bilaterally.  The toe waveforms in the bilateral lower extremities are slightly dampened.  There is no TBI in the right due to it being noncompressible.   Review of Systems  Cardiovascular: Positive for leg swelling.  Skin: Positive for wound.  All other systems reviewed and are negative.      Objective:   Physical Exam Vitals reviewed.  HENT:     Head: Normocephalic.  Cardiovascular:     Rate and Rhythm: Normal rate.     Pulses: Normal pulses.  Pulmonary:     Effort: Pulmonary effort is normal.  Neurological:     Mental Status: She is alert and oriented to person, place,  and time.  Psychiatric:        Mood and Affect: Mood normal.        Behavior: Behavior normal.        Thought Content: Thought content normal.        Judgment: Judgment normal.     BP (!) 191/82 (BP Location: Right Arm)   Pulse 60   Resp 16   Wt 218 lb (98.9 kg)   BMI 37.42 kg/m   Past Medical History:  Diagnosis Date  . Anemia   . Cancer ([place]) in her 31s   basal cell carcinoma  . Colon polyps 2012  . COPD (chronic obstructive pulmonary disease) ([place])   . Cystitis 2013  . Dysrhythmia   . GERD (gastroesophageal reflux disease)   . Heart murmur   . Hyperlipidemia     Social History   Socioeconomic History  . Marital status: Married    Spouse name: Not on file  . Number of children: Not on file  . Years of education: Not on file  . Highest education level: Not on file  Occupational History  . Not on file  Tobacco Use  . Smoking status: Former Smoker    Quit date: 08/05/1996    Years since quitting: 24.4  . Smokeless tobacco: Never Used  Substance and Sexual Activity  . Alcohol use: No  . Drug use: No  .  Sexual activity: Not on file  Other Topics Concern  . Not on file  Social History Narrative  . Not on file   Social Determinants of Health   Financial Resource Strain: Not on file  Food Insecurity: Not on file  Transportation Needs: Not on file  Physical Activity: Not on file  Stress: Not on file  Social Connections: Not on file  Intimate Partner Violence: Not on file    Past Surgical History:  Procedure Laterality Date  . ABDOMINAL HYSTERECTOMY    . 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  Marland Kitchen CESAREAN SECTION    . COLONOSCOPY  2012   Dr Vira Agar  . COLONOSCOPY WITH PROPOFOL N/A 09/21/2017   Procedure: COLONOSCOPY WITH PROPOFOL;  Surgeon: Toledo, Benay Pike, MD;  Location: [facility] ENDOSCOPY;  Service: Gastroenterology;  Laterality: N/A;  . FRACTURE SURGERY Left 2013   arm  . ORIF ANKLE FRACTURE Right  08/06/2016   Procedure: OPEN REDUCTION INTERNAL FIXATION (ORIF) ANKLE FRACTURE;  Surgeon: Thornton Park, MD;  Location: [facility] ORS;  Service: Orthopedics;  Laterality: Right;  SMALL FRAGMENT SET SYNTHES 4.0MM CANNULATED SCREW SET       Family History  Problem Relation Age of Onset  . Colon cancer Sister     Allergies  Allergen Reactions  . Simvastatin Swelling    Other reaction(s): Muscle Pain, Other (See Comments)  . Sulfacetamide Sodium Hives  . Isosorbide Nitrate     Other reaction(s): Headache, Other (See Comments)  . Amoxicillin Hives  . Bisoprolol Hives  . Clindamycin/Lincomycin Hives  . Codeine Other (See Comments)    headache Other reaction(s): Headache, Other (See Comments) headache   . Imdur [Isosorbide Dinitrate]   . Keflex [Cephalexin] Hives  . Penicillins Hives  . Sulfa Antibiotics Hives    CBC Latest Ref Rng & Units 07/26/2018 08/07/2016 08/05/2016  WBC 4.0 - 10.5 K/uL 10.4 12.9(H) 11.3(H)  Hemoglobin 12.0 - 15.0 g/dL 11.7(L) 10.7(L) 12.3  Hematocrit 36.0 - 46.0 % 37.6 32.2(L) 37.6  Platelets 150 - 400 K/uL 206 279 290      CMP     Component Value Date/Time   NA 139 07/26/2018 1623   NA 142 04/26/2014 1123   K 3.7 07/26/2018 1623   K 4.1 04/26/2014 1123   CL 107 07/26/2018 1623   CL 110 (H) 04/26/2014 1123   CO2 25 07/26/2018 1623   CO2 27 04/26/2014 1123   GLUCOSE 124 (H) 07/26/2018 1623   GLUCOSE 96 04/26/2014 1123   BUN 18 07/26/2018 1623   BUN 19 (H) 04/26/2014 1123   CREATININE 1.14 (H) 07/26/2018 1623   CREATININE 0.89 04/26/2014 1123   CALCIUM 9.3 07/26/2018 1623   CALCIUM 9.1 04/26/2014 1123   GFRNONAA 47 (L) 07/26/2018 1623   GFRNONAA >60 04/26/2014 1123   GFRAA 54 (L) 07/26/2018 1623   GFRAA >60 04/26/2014 1123     VAS [place] ABI WITH/WO TBI  Result Date: 01/14/2021  [facility] Patient Name:  Renee Meyer  Date of Exam:   01/08/2021 Medical Rec #: [id number]       Accession #:    [id number] Date of Birth:  [birth year]       Patient Gender: F Patient Age:   35Y Exam Location:  [facility] Procedure:      VAS [place] ABI WITH/WO TBI Referring Phys: [id number] [place] --------------------------------------------------------------------------------  Indications: Rest pain.  Performing Technologist: Charlane Ferretti RT (R)(VS)  Examination  Guidelines: A complete evaluation includes at minimum, Doppler waveform signals and systolic blood pressure reading at the level of bilateral brachial, anterior tibial, and posterior tibial arteries, when vessel segments are accessible. Bilateral testing is considered an integral part of a complete examination. Photoelectric Plethysmograph (PPG) waveforms and toe systolic pressure readings are included as required and additional duplex testing as needed. Limited examinations for reoccurring indications may be performed as noted.  ABI Findings: +---------+------------------+-----+---------+----------------+ Right    Rt Pressure (mmHg)IndexWaveform Comment          +---------+------------------+-----+---------+----------------+ Brachial 210                                              +---------+------------------+-----+---------+----------------+ ATA      208               0.99 triphasic                 +---------+------------------+-----+---------+----------------+ PTA      214               1.02 triphasic                 +---------+------------------+-----+---------+----------------+ Great Toe                       Abnormal Non Compressable +---------+------------------+-----+---------+----------------+ +---------+------------------+-----+---------+-------+ Left     Lt Pressure (mmHg)IndexWaveform Comment +---------+------------------+-----+---------+-------+ Brachial 207                                     +---------+------------------+-----+---------+-------+ ATA      202               0.96 triphasic         +---------+------------------+-----+---------+-------+ PTA      219               1.04 triphasic        +---------+------------------+-----+---------+-------+ Great Toe68                0.32 Abnormal         +---------+------------------+-----+---------+-------+ Summary: Right: Resting right ankle-brachial index is within normal range. No evidence of significant right lower extremity arterial disease. The right toe-brachial index is normal. Left: Resting left ankle-brachial index is within normal range. No evidence of significant left lower extremity arterial disease. The left toe-brachial index is abnormal.  *See table(s) above for measurements and observations.  Electronically signed by Leotis Pain MD on 01/14/2021 at 12:41:55 PM.    Final        Assessment & Plan:   1. Lower limb ulcer, ankle, right, limited to breakdown of skin ([place]) Based on the patient's long-lasting wound, and based on the noninvasive studies today, I feel that unna wraps are a reasonable consideration to see if this helps with the patient's wound healing.  I also suspect that the patient's longstanding swelling is also prohibiting him from being able to heal.  In addition, behavioral modification including several periods of elevation of the lower extremities during the day will be continued. Achieving a position with the ankles at heart level was stressed to the patient  The patient is instructed to begin routine exercise, especially walking on a daily basis  Patient should undergo duplex ultrasound of the venous system to ensure that  DVT or reflux is not present.  Following the review of the ultrasound the patient will follow up in one week to reassess the degree of swelling and the control that Unna therapy is offering.   The patient can be assessed for graduated compression stockings or wraps as well as a Lymph Pump once the ulcers are healed.   2. Mixed hyperlipidemia Continue statin as ordered and reviewed,  no changes at this time   3. COPD, moderate ([place]) Continue pulmonary medications and aerosols as already ordered, these medications have been reviewed and there are no changes at this time.    4. GERD without esophagitis Continue PPI as already ordered, this medication has been reviewed and there are no changes at this time.  Avoidence of caffeine and alcohol  Moderate elevation of the head of the bed    Current Outpatient Medications on File Prior to Visit  Medication Sig Dispense Refill  . Calcium Carbonate+Vitamin D 600-200 MG-UNIT TABS Take by mouth.    . Cholecalciferol (D3 DOTS) 50 MCG (2000 UT) TBDP Take by mouth.    . ferrous sulfate 325 (65 FE) MG tablet Take 325 mg by mouth daily with breakfast.    . furosemide (LASIX) 20 MG tablet Take 20 mg by mouth. 1.5 tablets per day.    Marland Kitchen GLUCOSAMINE-CALCIUM-VIT D PO Take 1 tablet by mouth daily.    Marland Kitchen loratadine (CLARITIN) 10 MG tablet Take 10 mg by mouth daily.    . magnesium oxide (MAG-OX) 400 MG tablet Take 400 mg by mouth daily.    . meclizine (ANTIVERT) 25 MG tablet Take 1 tablet by mouth as needed.     . metolazone (ZAROXOLYN) 2.5 MG tablet Take 2.5 mg by mouth 2 (two) times a week.    . metoprolol succinate (TOPROL-XL) 50 MG 24 hr tablet Take 1 tablet by mouth daily.    Marland Kitchen omeprazole (PRILOSEC) 20 MG capsule Take 20 mg by mouth daily.    . potassium chloride (K-DUR,KLOR-CON) 10 MEQ tablet Take 10 mEq by mouth once.    . pravastatin (PRAVACHOL) 40 MG tablet Take 1 tablet by mouth daily.    . vitamin B-12 (CYANOCOBALAMIN) 1000 MCG tablet Take 1,000 mcg by mouth daily.     No current facility-administered medications on file prior to visit.    There are no Patient Instructions on file for this visit. No follow-ups on file.   Kris Hartmann, NP

## 2021-01-19 ENCOUNTER — Encounter (INDEPENDENT_AMBULATORY_CARE_PROVIDER_SITE_OTHER): Payer: Self-pay | Admitting: Nurse Practitioner

## 2021-01-20 DIAGNOSIS — E782 Mixed hyperlipidemia: Secondary | ICD-10-CM | POA: Diagnosis not present

## 2021-01-20 DIAGNOSIS — E538 Deficiency of other specified B group vitamins: Secondary | ICD-10-CM | POA: Diagnosis not present

## 2021-01-20 DIAGNOSIS — D500 Iron deficiency anemia secondary to blood loss (chronic): Secondary | ICD-10-CM | POA: Diagnosis not present

## 2021-01-22 ENCOUNTER — Encounter (INDEPENDENT_AMBULATORY_CARE_PROVIDER_SITE_OTHER): Payer: Self-pay | Admitting: Nurse Practitioner

## 2021-01-22 ENCOUNTER — Ambulatory Visit (INDEPENDENT_AMBULATORY_CARE_PROVIDER_SITE_OTHER): Payer: PPO | Admitting: Nurse Practitioner

## 2021-01-22 ENCOUNTER — Other Ambulatory Visit: Payer: Self-pay

## 2021-01-22 VITALS — BP 182/72 | HR 58 | Ht 64.0 in | Wt 221.0 lb

## 2021-01-22 DIAGNOSIS — L97311 Non-pressure chronic ulcer of right ankle limited to breakdown of skin: Secondary | ICD-10-CM | POA: Diagnosis not present

## 2021-01-22 NOTE — Progress Notes (Signed)
History of Present Illness  There is no documented history at this time  Assessments & Plan   There are no diagnoses linked to this encounter.    Additional instructions  Subjective:  Patient presents with venous ulcer of the Right lower extremity.    Procedure:  3 layer unna wrap was placed Right lower extremity.   Plan:   Follow up in one week.   

## 2021-01-24 ENCOUNTER — Telehealth (INDEPENDENT_AMBULATORY_CARE_PROVIDER_SITE_OTHER): Payer: Self-pay | Admitting: Nurse Practitioner

## 2021-01-27 NOTE — Telephone Encounter (Signed)
Documentation only - spoke with April about conversation

## 2021-01-28 DIAGNOSIS — E538 Deficiency of other specified B group vitamins: Secondary | ICD-10-CM | POA: Diagnosis not present

## 2021-01-28 DIAGNOSIS — I2781 Cor pulmonale (chronic): Secondary | ICD-10-CM | POA: Diagnosis not present

## 2021-01-28 DIAGNOSIS — J449 Chronic obstructive pulmonary disease, unspecified: Secondary | ICD-10-CM | POA: Diagnosis not present

## 2021-01-28 DIAGNOSIS — Z0000 Encounter for general adult medical examination without abnormal findings: Secondary | ICD-10-CM | POA: Diagnosis not present

## 2021-01-28 DIAGNOSIS — E782 Mixed hyperlipidemia: Secondary | ICD-10-CM | POA: Diagnosis not present

## 2021-01-29 ENCOUNTER — Ambulatory Visit (INDEPENDENT_AMBULATORY_CARE_PROVIDER_SITE_OTHER): Payer: PPO | Admitting: Nurse Practitioner

## 2021-01-29 ENCOUNTER — Other Ambulatory Visit: Payer: Self-pay

## 2021-01-29 VITALS — BP 85/50 | HR 64 | Ht 64.0 in | Wt 218.0 lb

## 2021-01-29 DIAGNOSIS — L97311 Non-pressure chronic ulcer of right ankle limited to breakdown of skin: Secondary | ICD-10-CM | POA: Diagnosis not present

## 2021-01-29 NOTE — Progress Notes (Signed)
History of Present Illness  There is no documented history at this time  Assessments & Plan   There are no diagnoses linked to this encounter.    Additional instructions  Subjective:  Patient presents with venous ulcer of the Right lower extremity.    Procedure:  3 layer unna wrap was placed Right lower extremity.   Plan:   Follow up in one week.   

## 2021-02-03 ENCOUNTER — Encounter (INDEPENDENT_AMBULATORY_CARE_PROVIDER_SITE_OTHER): Payer: Self-pay | Admitting: Nurse Practitioner

## 2021-02-05 ENCOUNTER — Ambulatory Visit (INDEPENDENT_AMBULATORY_CARE_PROVIDER_SITE_OTHER): Payer: PPO | Admitting: Nurse Practitioner

## 2021-02-05 ENCOUNTER — Other Ambulatory Visit: Payer: Self-pay

## 2021-02-05 ENCOUNTER — Encounter (INDEPENDENT_AMBULATORY_CARE_PROVIDER_SITE_OTHER): Payer: Self-pay | Admitting: Nurse Practitioner

## 2021-02-05 VITALS — BP 182/73 | HR 65 | Resp 16 | Wt 218.0 lb

## 2021-02-05 DIAGNOSIS — L97311 Non-pressure chronic ulcer of right ankle limited to breakdown of skin: Secondary | ICD-10-CM | POA: Diagnosis not present

## 2021-02-05 DIAGNOSIS — E782 Mixed hyperlipidemia: Secondary | ICD-10-CM

## 2021-02-05 NOTE — Progress Notes (Signed)
Subjective:    Patient ID: Renee Meyer, female    DOB: 02-28-1941, 80 y.o.   MRN: [id number] Chief Complaint  Patient presents with  . Follow-up    4 wk unna boot check    Renee Meyer is a 80 y.o. female.  The patient returns today for follow-up evaluation of a nonhealing ulcer of the right lateral lower extremity.  The patient recently was in [place] wraps.  The wraps have helped the wound significantly.  At this time there is no open wound and just a small scabbed over area at the proximal portion.  The patient notes that in the next few days she has upcoming appointment with her orthopedic physician to discuss possible hardware removal.  Previous noninvasive studies show adequate blood flow for wound healing if surgery does occur.  She has any fevers or chills.  She tolerated the Unna wraps well.    Review of Systems  Cardiovascular:  Positive for leg swelling.  All other systems reviewed and are negative.     Objective:   Physical Exam Vitals reviewed.  HENT:     Head: Normocephalic.  Cardiovascular:     Rate and Rhythm: Normal rate.     Pulses: Normal pulses.  Pulmonary:     Effort: Pulmonary effort is normal.  Musculoskeletal:     Right lower leg: 1+ Edema present.  Neurological:     Mental Status: She is alert and oriented to person, place, and time.  Psychiatric:        Mood and Affect: Mood normal.        Behavior: Behavior normal.        Thought Content: Thought content normal.        Judgment: Judgment normal.    BP (!) 182/73 (BP Location: Right Arm)   Pulse 65   Resp 16   Wt 218 lb (98.9 kg)   BMI 37.42 kg/m   Past Medical History:  Diagnosis Date  . Anemia   . Cancer ([place]) in her 52s   basal cell carcinoma  . Colon polyps 2012  . COPD (chronic obstructive pulmonary disease) ([place])   . Cystitis 2013  . Dysrhythmia   . GERD (gastroesophageal reflux disease)   . Heart murmur   . Hyperlipidemia     Social History   Socioeconomic History  .  Marital status: Married    Spouse name: Not on file  . Number of children: Not on file  . Years of education: Not on file  . Highest education level: Not on file  Occupational History  . Not on file  Tobacco Use  . Smoking status: Former    Pack years: 0.00    Types: Cigarettes    Quit date: 08/05/1996    Years since quitting: 24.5  . Smokeless tobacco: Never  Substance and Sexual Activity  . Alcohol use: No  . Drug use: No  . Sexual activity: Not on file  Other Topics Concern  . Not on file  Social History Narrative  . Not on file   Social Determinants of Health   Financial Resource Strain: Not on file  Food Insecurity: Not on file  Transportation Needs: Not on file  Physical Activity: Not on file  Stress: Not on file  Social Connections: Not on file  Intimate Partner Violence: Not on file    Past Surgical History:  Procedure Laterality Date  . ABDOMINAL HYSTERECTOMY    . BREAST SURGERY Right July 31, 2013  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  Marland Kitchen CESAREAN SECTION    . COLONOSCOPY  2012   Dr Vira Agar  . COLONOSCOPY WITH PROPOFOL N/A 09/21/2017   Procedure: COLONOSCOPY WITH PROPOFOL;  Surgeon: Toledo, Benay Pike, MD;  Location: [facility] ENDOSCOPY;  Service: Gastroenterology;  Laterality: N/A;  . FRACTURE SURGERY Left 2013   arm  . ORIF ANKLE FRACTURE Right 08/06/2016   Procedure: OPEN REDUCTION INTERNAL FIXATION (ORIF) ANKLE FRACTURE;  Surgeon: Thornton Park, MD;  Location: [facility] ORS;  Service: Orthopedics;  Laterality: Right;  SMALL FRAGMENT SET SYNTHES 4.0MM CANNULATED SCREW SET       Family History  Problem Relation Age of Onset  . Colon cancer Sister     Allergies  Allergen Reactions  . Simvastatin Swelling    Other reaction(s): Muscle Pain, Other (See Comments)  . Sulfacetamide Sodium Hives  . Isosorbide Nitrate     Other reaction(s): Headache, Other (See Comments)  . Amoxicillin Hives  . Bisoprolol Hives  . Clindamycin/Lincomycin Hives  .  Codeine Other (See Comments)    headache Other reaction(s): Headache, Other (See Comments) headache   . Imdur [Isosorbide Dinitrate]   . Keflex [Cephalexin] Hives  . Penicillins Hives  . Sulfa Antibiotics Hives    CBC Latest Ref Rng & Units 07/26/2018 08/07/2016 08/05/2016  WBC 4.0 - 10.5 K/uL 10.4 12.9(H) 11.3(H)  Hemoglobin 12.0 - 15.0 g/dL 11.7(L) 10.7(L) 12.3  Hematocrit 36.0 - 46.0 % 37.6 32.2(L) 37.6  Platelets 150 - 400 K/uL 206 279 290      CMP     Component Value Date/Time   NA 139 07/26/2018 1623   NA 142 04/26/2014 1123   K 3.7 07/26/2018 1623   K 4.1 04/26/2014 1123   CL 107 07/26/2018 1623   CL 110 (H) 04/26/2014 1123   CO2 25 07/26/2018 1623   CO2 27 04/26/2014 1123   GLUCOSE 124 (H) 07/26/2018 1623   GLUCOSE 96 04/26/2014 1123   BUN 18 07/26/2018 1623   BUN 19 (H) 04/26/2014 1123   CREATININE 1.14 (H) 07/26/2018 1623   CREATININE 0.89 04/26/2014 1123   CALCIUM 9.3 07/26/2018 1623   CALCIUM 9.1 04/26/2014 1123   GFRNONAA 47 (L) 07/26/2018 1623   GFRNONAA >60 04/26/2014 1123   GFRAA 54 (L) 07/26/2018 1623   GFRAA >60 04/26/2014 1123     VAS [place] ABI WITH/WO TBI  Result Date: 01/14/2021  [facility] STUDY Patient Name:  EVONNE RINKS  Date of Exam:   01/08/2021 Medical Rec #: [id number]       Accession #:    [id number] Date of Birth: October 11, 1940       Patient Gender: F Patient Age:   43Y Exam Location:  [facility] Procedure:      VAS [place] ABI WITH/WO TBI Referring Phys: [id number] [place] --------------------------------------------------------------------------------  Indications: Rest pain.  Performing Technologist: Charlane Ferretti RT (R)(VS)  Examination Guidelines: A complete evaluation includes at minimum, Doppler waveform signals and systolic blood pressure reading at the level of bilateral brachial, anterior tibial, and posterior tibial arteries, when vessel segments are accessible. Bilateral testing is considered an integral part  of a complete examination. Photoelectric Plethysmograph (PPG) waveforms and toe systolic pressure readings are included as required and additional duplex testing as needed. Limited examinations for reoccurring indications may be performed as noted.  ABI Findings: +---------+------------------+-----+---------+----------------+ Right    Rt Pressure (mmHg)IndexWaveform Comment          +---------+------------------+-----+---------+----------------+ Brachial 210                                              +---------+------------------+-----+---------+----------------+  ATA      208               0.99 triphasic                 +---------+------------------+-----+---------+----------------+ PTA      214               1.02 triphasic                 +---------+------------------+-----+---------+----------------+ Great Toe                       Abnormal Non Compressable +---------+------------------+-----+---------+----------------+ +---------+------------------+-----+---------+-------+ Left     Lt Pressure (mmHg)IndexWaveform Comment +---------+------------------+-----+---------+-------+ Brachial 207                                     +---------+------------------+-----+---------+-------+ ATA      202               0.96 triphasic        +---------+------------------+-----+---------+-------+ PTA      219               1.04 triphasic        +---------+------------------+-----+---------+-------+ Great Toe68                0.32 Abnormal         +---------+------------------+-----+---------+-------+ Summary: Right: Resting right ankle-brachial index is within normal range. No evidence of significant right lower extremity arterial disease. The right toe-brachial index is normal. Left: Resting left ankle-brachial index is within normal range. No evidence of significant left lower extremity arterial disease. The left toe-brachial index is abnormal.  *See table(s) above  for measurements and observations.  Electronically signed by Leotis Pain MD on 01/14/2021 at 12:41:55 PM.    Final        Assessment & Plan:   1. Lower limb ulcer, ankle, right, limited to breakdown of skin ([place]) The ulcerated lesion that was slow to heal on her ankle has essentially healed at this time.  They are interactive and very helpful with this.  Advised the patient to utilize medical grade compression stockings daily as well as to elevate and be active as possible.  The patient does have an upcoming evaluation for possible hardware removal from her ankle.  If indeed this is necessary previous ABIs show that she should have adequate ability for wound healing.  I do suspect that these reason for slow healing had some degree to her swelling.  We will have the patient follow-up with [place] in an as-needed basis.  2. Mixed hyperlipidemia Continue statin as ordered and reviewed, no changes at this time    Current Outpatient Medications on File Prior to Visit  Medication Sig Dispense Refill  . Calcium Carbonate+Vitamin D 600-200 MG-UNIT TABS Take by mouth.    . Cholecalciferol (D3 DOTS) 50 MCG (2000 UT) TBDP Take by mouth.    . ferrous sulfate 325 (65 FE) MG tablet Take 325 mg by mouth daily with breakfast.    . furosemide (LASIX) 20 MG tablet Take 20 mg by mouth. 1.5 tablets per day.    Marland Kitchen GLUCOSAMINE-CALCIUM-VIT D PO Take 1 tablet by mouth daily.    Marland Kitchen loratadine (CLARITIN) 10 MG tablet Take 10 mg by mouth daily.    . magnesium oxide (MAG-OX) 400 MG tablet Take 400 mg by mouth daily.    . meclizine (ANTIVERT) 25  MG tablet Take 1 tablet by mouth as needed.     . metolazone (ZAROXOLYN) 2.5 MG tablet Take 2.5 mg by mouth 2 (two) times a week.    . metoprolol succinate (TOPROL-XL) 50 MG 24 hr tablet Take 1 tablet by mouth daily.    Marland Kitchen omeprazole (PRILOSEC) 20 MG capsule Take 20 mg by mouth daily.    . potassium chloride (K-DUR,KLOR-CON) 10 MEQ tablet Take 10 mEq by mouth once.    . pravastatin  (PRAVACHOL) 40 MG tablet Take 1 tablet by mouth daily.    . vitamin B-12 (CYANOCOBALAMIN) 1000 MCG tablet Take 1,000 mcg by mouth daily.     No current facility-administered medications on file prior to visit.    There are no Patient Instructions on file for this visit. No follow-ups on file.   Kris Hartmann, NP

## 2021-02-14 DIAGNOSIS — S8291XA Unspecified fracture of right lower leg, initial encounter for closed fracture: Secondary | ICD-10-CM | POA: Diagnosis not present

## 2021-02-14 DIAGNOSIS — R600 Localized edema: Secondary | ICD-10-CM | POA: Diagnosis not present

## 2021-02-21 DIAGNOSIS — T8484XA Pain due to internal orthopedic prosthetic devices, implants and grafts, initial encounter: Secondary | ICD-10-CM | POA: Diagnosis not present

## 2021-02-21 DIAGNOSIS — T8132XA Disruption of internal operation (surgical) wound, not elsewhere classified, initial encounter: Secondary | ICD-10-CM | POA: Diagnosis not present

## 2021-02-22 ENCOUNTER — Other Ambulatory Visit: Payer: Self-pay | Admitting: Orthopedic Surgery

## 2021-02-27 ENCOUNTER — Other Ambulatory Visit: Payer: Self-pay

## 2021-02-27 ENCOUNTER — Other Ambulatory Visit
Admission: RE | Admit: 2021-02-27 | Discharge: 2021-02-27 | Disposition: A | Discharge status: Home / Self Care | Payer: PPO | Source: Ambulatory Visit | Attending: Orthopedic Surgery | Admitting: Orthopedic Surgery

## 2021-02-27 DIAGNOSIS — Z01818 Encounter for other preprocedural examination: Secondary | ICD-10-CM | POA: Insufficient documentation

## 2021-02-27 HISTORY — DX: Pneumonia, unspecified organism: J18.9

## 2021-02-27 HISTORY — DX: Dyspnea, unspecified: R06.00

## 2021-02-27 NOTE — Patient Instructions (Addendum)
Your procedure is scheduled on: 03/04/21 Tuesday Report to the Registration Desk on the 1st floor of the [facility]. To find out your arrival time, please call [phone] between 1PM - 3PM on: Monday REPORT TO MEDICAL ARTS 02/28/21 AT 11:30 AM FOR LABS/EKG/BAG.  REMEMBER: Instructions that are not followed completely may result in serious medical risk, up to and including death; or upon the discretion of your surgeon and anesthesiologist your surgery may need to be rescheduled.  Do not eat food after midnight the night before surgery.  No gum chewing, lozengers or hard candies.  TAKE THESE MEDICATIONS THE MORNING OF SURGERY WITH A SIP OF WATER:  - metoprolol succinate (TOPROL-XL) 50 MG 24 hr tablet - omeprazole (PRILOSEC) 20 MG capsule  One week prior to surgery: Stop Anti-inflammatories (NSAIDS) such as Advil, Aleve, Ibuprofen, Motrin, Naproxen, Naprosyn and Aspirin based products such as Excedrin, Goodys Powder, BC Powder.  Stop ANY OVER THE COUNTER supplements until after surgery.  You may however, continue to take Tylenol if needed for pain up until the day of surgery.  No Alcohol for 24 hours before or after surgery.  No Smoking including e-cigarettes for 24 hours prior to surgery.  No chewable tobacco products for at least 6 hours prior to surgery.  No nicotine patches on the day of surgery.  Do not use any "recreational" drugs for at least a week prior to your surgery.  Please be advised that the combination of cocaine and anesthesia may have negative outcomes, up to and including death. If you test positive for cocaine, your surgery will be cancelled.  On the morning of surgery brush your teeth with toothpaste and water, you may rinse your mouth with mouthwash if you wish. Do not swallow any toothpaste or mouthwash.  Do not wear jewelry, make-up, hairpins, clips or nail polish.  Do not wear lotions, powders, or perfumes.   Do not shave body from the neck down 48  hours prior to surgery just in case you cut yourself which could leave a site for infection.  Also, freshly shaved skin may become irritated if using the CHG soap.  Contact lenses, hearing aids and dentures may not be worn into surgery.  Do not bring valuables to the hospital. [facility] is not responsible for any missing/lost belongings or valuables.   Use CHG Soap or wipes as directed on instruction sheet.  Notify your doctor if there is any change in your medical condition (cold, fever, infection).  Wear comfortable clothing (specific to your surgery type) to the hospital.  After surgery, you can help prevent lung complications by doing breathing exercises.  Take deep breaths and cough every 1-2 hours. Your doctor may order a device called an Incentive Spirometer to help you take deep breaths. When coughing or sneezing, hold a pillow firmly against your incision with both hands. This is called "splinting." Doing this helps protect your incision. It also decreases belly discomfort.  If you are being admitted to the hospital overnight, leave your suitcase in the car. After surgery it may be brought to your room.  If you are being discharged the day of surgery, you will not be allowed to drive home. You will need a responsible adult (18 years or older) to drive you home and stay with you that night.   If you are taking public transportation, you will need to have a responsible adult (18 years or older) with you. Please confirm with your physician that it is acceptable to  use public transportation.   Please call the [facility] at [phone] if you have any questions about these instructions.  Surgery Visitation Policy:  Patients undergoing a surgery or procedure may have one family member or support person with them as long as that person is not COVID-19 positive or experiencing its symptoms.  That person may remain in the waiting area during the  procedure.  Inpatient Visitation:    Visiting hours are 7 a.m. to 8 p.m. Inpatients will be allowed two visitors daily. The visitors may change each day during the patient's stay. No visitors under the age of 33. Any visitor under the age of 66 must be accompanied by an adult. The visitor must pass COVID-19 screenings, use hand sanitizer when entering and exiting the patient's room and wear a mask at all times, including in the patient's room. Patients must also wear a mask when staff or their visitor are in the room. Masking is required regardless of vaccination status.

## 2021-02-28 ENCOUNTER — Encounter: Payer: Self-pay | Admitting: Urgent Care

## 2021-02-28 ENCOUNTER — Other Ambulatory Visit
Admission: RE | Admit: 2021-02-28 | Discharge: 2021-02-28 | Disposition: A | Discharge status: Home / Self Care | Payer: PPO | Source: Ambulatory Visit | Attending: Orthopedic Surgery | Admitting: Orthopedic Surgery

## 2021-02-28 DIAGNOSIS — Z01810 Encounter for preprocedural cardiovascular examination: Secondary | ICD-10-CM | POA: Diagnosis not present

## 2021-02-28 DIAGNOSIS — Z01818 Encounter for other preprocedural examination: Secondary | ICD-10-CM | POA: Diagnosis not present

## 2021-02-28 LAB — CBC WITH DIFFERENTIAL/PLATELET
Abs Immature Granulocytes: 0.03 10*3/uL (ref 0.00–0.07)
Basophils Absolute: 0.1 10*3/uL (ref 0.0–0.1)
Basophils Relative: 1 %
Eosinophils Absolute: 0.2 10*3/uL (ref 0.0–0.5)
Eosinophils Relative: 3 %
HCT: 35 % — ABNORMAL LOW (ref 36.0–46.0)
Hemoglobin: 11.4 g/dL — ABNORMAL LOW (ref 12.0–15.0)
Immature Granulocytes: 0 %
Lymphocytes Relative: 20 %
Lymphs Abs: 1.4 10*3/uL (ref 0.7–4.0)
MCH: 29.9 pg (ref 26.0–34.0)
MCHC: 32.6 g/dL (ref 30.0–36.0)
MCV: 91.9 fL (ref 80.0–100.0)
Monocytes Absolute: 0.6 10*3/uL (ref 0.1–1.0)
Monocytes Relative: 8 %
Neutro Abs: 5 10*3/uL (ref 1.7–7.7)
Neutrophils Relative %: 68 %
Platelets: 188 10*3/uL (ref 150–400)
RBC: 3.81 MIL/uL — ABNORMAL LOW (ref 3.87–5.11)
RDW: 12.8 % (ref 11.5–15.5)
WBC: 7.3 10*3/uL (ref 4.0–10.5)
nRBC: 0 % (ref 0.0–0.2)

## 2021-02-28 LAB — BASIC METABOLIC PANEL
Anion gap: 5 (ref 5–15)
BUN: 17 mg/dL (ref 8–23)
CO2: 29 mmol/L (ref 22–32)
Calcium: 9.7 mg/dL (ref 8.9–10.3)
Chloride: 108 mmol/L (ref 98–111)
Creatinine, Ser: 1.14 mg/dL — ABNORMAL HIGH (ref 0.44–1.00)
GFR, Estimated: 50 mL/min — ABNORMAL LOW (ref >60)
Glucose, Bld: 106 mg/dL — ABNORMAL HIGH (ref 70–99)
Potassium: 4.1 mmol/L (ref 3.5–5.1)
Sodium: 142 mmol/L (ref 135–145)

## 2021-02-28 LAB — APTT: aPTT: 27 seconds (ref 24–36)

## 2021-02-28 LAB — PROTIME-INR
INR: 1 (ref 0.8–1.2)
Prothrombin Time: 13.1 seconds (ref 11.4–15.2)

## 2021-03-03 MED ORDER — ORAL CARE MOUTH RINSE: 15.0000 mL | Freq: Once | OROMUCOSAL | Status: AC

## 2021-03-03 MED ORDER — VANCOMYCIN HCL IN DEXTROSE 1-5 GM/200ML-% IV SOLN
1000.0000 mg | INTRAVENOUS | Status: AC
  Administered 2021-03-04: 1000 mg via INTRAVENOUS

## 2021-03-03 MED ORDER — CHLORHEXIDINE GLUCONATE 0.12 % MT SOLN: 15.0000 mL | Freq: Once | OROMUCOSAL | Status: AC

## 2021-03-03 MED ORDER — CHLORHEXIDINE GLUCONATE CLOTH 2 % EX PADS
6.0000 | MEDICATED_PAD | Freq: Once | CUTANEOUS | Status: AC
  Administered 2021-03-04: 6 via TOPICAL

## 2021-03-03 MED ORDER — ACETAMINOPHEN 500 MG PO TABS: 1000.0000 mg | ORAL_TABLET | ORAL | Status: AC

## 2021-03-03 MED ORDER — LACTATED RINGERS IV SOLN: INTRAVENOUS | Status: DC

## 2021-03-04 ENCOUNTER — Encounter
Admission: RE | Disposition: A | Discharge status: Home / Self Care | Payer: Self-pay | Source: Home / Self Care | Attending: Orthopedic Surgery

## 2021-03-04 ENCOUNTER — Encounter: Payer: Self-pay | Admitting: Orthopedic Surgery

## 2021-03-04 ENCOUNTER — Other Ambulatory Visit: Payer: Self-pay

## 2021-03-04 ENCOUNTER — Ambulatory Visit: Payer: PPO

## 2021-03-04 ENCOUNTER — Ambulatory Visit
Admission: RE | Admit: 2021-03-04 | Discharge: 2021-03-04 | Disposition: A | Discharge status: Home / Self Care | Payer: PPO | Attending: Orthopedic Surgery | Admitting: Orthopedic Surgery

## 2021-03-04 DIAGNOSIS — T8484XA Pain due to internal orthopedic prosthetic devices, implants and grafts, initial encounter: Secondary | ICD-10-CM | POA: Diagnosis not present

## 2021-03-04 DIAGNOSIS — Z79899 Other long term (current) drug therapy: Secondary | ICD-10-CM | POA: Insufficient documentation

## 2021-03-04 DIAGNOSIS — Z85828 Personal history of other malignant neoplasm of skin: Secondary | ICD-10-CM | POA: Insufficient documentation

## 2021-03-04 DIAGNOSIS — Z87891 Personal history of nicotine dependence: Secondary | ICD-10-CM | POA: Diagnosis not present

## 2021-03-04 DIAGNOSIS — Z888 Allergy status to other drugs, medicaments and biological substances status: Secondary | ICD-10-CM | POA: Diagnosis not present

## 2021-03-04 DIAGNOSIS — Z880 Allergy status to penicillin: Secondary | ICD-10-CM | POA: Diagnosis not present

## 2021-03-04 DIAGNOSIS — T8189XA Other complications of procedures, not elsewhere classified, initial encounter: Secondary | ICD-10-CM | POA: Diagnosis not present

## 2021-03-04 DIAGNOSIS — Z881 Allergy status to other antibiotic agents status: Secondary | ICD-10-CM | POA: Diagnosis not present

## 2021-03-04 DIAGNOSIS — Y838 Other surgical procedures as the cause of abnormal reaction of the patient, or of later complication, without mention of misadventure at the time of the procedure: Secondary | ICD-10-CM | POA: Diagnosis not present

## 2021-03-04 DIAGNOSIS — K219 Gastro-esophageal reflux disease without esophagitis: Secondary | ICD-10-CM | POA: Diagnosis not present

## 2021-03-04 DIAGNOSIS — Z885 Allergy status to narcotic agent status: Secondary | ICD-10-CM | POA: Insufficient documentation

## 2021-03-04 DIAGNOSIS — Z882 Allergy status to sulfonamides status: Secondary | ICD-10-CM | POA: Insufficient documentation

## 2021-03-04 DIAGNOSIS — T8142XA Infection following a procedure, deep incisional surgical site, initial encounter: Secondary | ICD-10-CM | POA: Diagnosis not present

## 2021-03-04 HISTORY — PX: HARDWARE REMOVAL: SHX979

## 2021-03-04 SURGERY — REMOVAL, HARDWARE
Anesthesia: General | Site: Ankle | Laterality: Right

## 2021-03-04 MED ORDER — FENTANYL CITRATE (PF) 100 MCG/2ML IJ SOLN
INTRAMUSCULAR | Status: AC
  Filled 2021-03-04: qty 2

## 2021-03-04 MED ORDER — DEXAMETHASONE SODIUM PHOSPHATE 10 MG/ML IJ SOLN
INTRAMUSCULAR | Status: DC | PRN
  Administered 2021-03-04: 10 mg via INTRAVENOUS

## 2021-03-04 MED ORDER — BUPIVACAINE HCL (PF) 0.25 % IJ SOLN
INTRAMUSCULAR | Status: DC | PRN
  Administered 2021-03-04: 30 mL

## 2021-03-04 MED ORDER — FENTANYL CITRATE (PF) 100 MCG/2ML IJ SOLN
INTRAMUSCULAR | Status: AC
  Administered 2021-03-04: 25 ug via INTRAVENOUS
  Filled 2021-03-04: qty 2

## 2021-03-04 MED ORDER — HYDROCODONE-ACETAMINOPHEN 5-325 MG PO TABS: 1.0000 | ORAL_TABLET | ORAL | 0 refills | Status: DC | PRN

## 2021-03-04 MED ORDER — OXYCODONE HCL 5 MG PO TABS: 5.0000 mg | ORAL_TABLET | Freq: Once | ORAL | Status: AC | PRN

## 2021-03-04 MED ORDER — FENTANYL CITRATE (PF) 100 MCG/2ML IJ SOLN
25.0000 ug | INTRAMUSCULAR | Status: AC | PRN
  Administered 2021-03-04 (×4): 25 ug via INTRAVENOUS

## 2021-03-04 MED ORDER — LIDOCAINE HCL (CARDIAC) PF 100 MG/5ML IV SOSY
PREFILLED_SYRINGE | INTRAVENOUS | Status: DC | PRN
  Administered 2021-03-04: 30 mg via INTRAVENOUS

## 2021-03-04 MED ORDER — BUPIVACAINE HCL (PF) 0.25 % IJ SOLN
INTRAMUSCULAR | Status: AC
  Filled 2021-03-04: qty 30

## 2021-03-04 MED ORDER — FENTANYL CITRATE (PF) 100 MCG/2ML IJ SOLN
INTRAMUSCULAR | Status: DC | PRN
  Administered 2021-03-04 (×4): 25 ug via INTRAVENOUS

## 2021-03-04 MED ORDER — ONDANSETRON HCL 4 MG/2ML IJ SOLN
INTRAMUSCULAR | Status: DC | PRN
Start: 2021-03-04 — End: 2021-03-04
  Administered 2021-03-04: 4 mg via INTRAVENOUS

## 2021-03-04 MED ORDER — ONDANSETRON HCL 4 MG/2ML IJ SOLN
INTRAMUSCULAR | Status: AC
  Filled 2021-03-04: qty 2

## 2021-03-04 MED ORDER — OXYCODONE HCL 5 MG PO TABS
ORAL_TABLET | ORAL | Status: AC
  Administered 2021-03-04: 5 mg via ORAL
  Filled 2021-03-04: qty 1

## 2021-03-04 MED ORDER — ACETAMINOPHEN 500 MG PO TABS
ORAL_TABLET | ORAL | Status: AC
  Administered 2021-03-04: 1000 mg via ORAL
  Filled 2021-03-04: qty 2

## 2021-03-04 MED ORDER — DEXAMETHASONE SODIUM PHOSPHATE 10 MG/ML IJ SOLN
INTRAMUSCULAR | Status: AC
  Filled 2021-03-04: qty 1

## 2021-03-04 MED ORDER — DOXYCYCLINE HYCLATE 50 MG PO CAPS: 100.0000 mg | ORAL_CAPSULE | Freq: Two times a day (BID) | ORAL | 0 refills | Status: DC

## 2021-03-04 MED ORDER — OXYCODONE HCL 5 MG/5ML PO SOLN: 5.0000 mg | Freq: Once | ORAL | Status: AC | PRN

## 2021-03-04 MED ORDER — PROPOFOL 10 MG/ML IV BOLUS
INTRAVENOUS | Status: DC | PRN
  Administered 2021-03-04: 100 mg via INTRAVENOUS

## 2021-03-04 MED ORDER — NEOMYCIN-POLYMYXIN B GU 40-200000 IR SOLN
Status: AC
  Filled 2021-03-04: qty 20

## 2021-03-04 MED ORDER — BUPIVACAINE HCL (PF) 0.5 % IJ SOLN
INTRAMUSCULAR | Status: AC
  Filled 2021-03-04: qty 30

## 2021-03-04 MED ORDER — VANCOMYCIN HCL IN DEXTROSE 1-5 GM/200ML-% IV SOLN
INTRAVENOUS | Status: AC
  Filled 2021-03-04: qty 200

## 2021-03-04 MED ORDER — MIDAZOLAM HCL 2 MG/2ML IJ SOLN
INTRAMUSCULAR | Status: AC
  Filled 2021-03-04: qty 2

## 2021-03-04 MED ORDER — CHLORHEXIDINE GLUCONATE 0.12 % MT SOLN
OROMUCOSAL | Status: AC
  Administered 2021-03-04: 15 mL via OROMUCOSAL
  Filled 2021-03-04: qty 15

## 2021-03-04 MED ORDER — ONDANSETRON HCL 4 MG PO TABS: 4.0000 mg | ORAL_TABLET | Freq: Three times a day (TID) | ORAL | 0 refills | Status: DC | PRN

## 2021-03-04 MED ORDER — FENTANYL CITRATE (PF) 100 MCG/2ML IJ SOLN
25.0000 ug | INTRAMUSCULAR | Status: AC | PRN
  Administered 2021-03-04 (×2): 25 ug via INTRAVENOUS

## 2021-03-04 MED ORDER — MIDAZOLAM HCL 2 MG/2ML IJ SOLN
INTRAMUSCULAR | Status: DC | PRN
  Administered 2021-03-04: 1 mg via INTRAVENOUS

## 2021-03-04 MED ORDER — 0.9 % SODIUM CHLORIDE (POUR BTL) OPTIME
TOPICAL | Status: DC | PRN
  Administered 2021-03-04: 500 mL
  Administered 2021-03-04: 1000 mL

## 2021-03-04 MED ORDER — NEOMYCIN-POLYMYXIN B GU 40-200000 IR SOLN
Status: DC | PRN
  Administered 2021-03-04: 4 mL

## 2021-03-04 SURGICAL SUPPLY — 50 items
BLADE SURG 15 STRL LF DISP TIS (BLADE) ×1 IMPLANT
BLADE SURG 15 STRL SS (BLADE) ×2
BNDG COHESIVE 4X5 TAN STRL (GAUZE/BANDAGES/DRESSINGS) IMPLANT
BNDG ELASTIC 4X5.8 VLCR STR LF (GAUZE/BANDAGES/DRESSINGS) ×2 IMPLANT
BNDG ESMARK 6X12 TAN STRL LF (GAUZE/BANDAGES/DRESSINGS) ×2 IMPLANT
BOOT STEPPER DURA MED (SOFTGOODS) ×2 IMPLANT
BUR 4X55 1 (BURR) ×4 IMPLANT
CANISTER SUCT 1200ML W/VALVE (MISCELLANEOUS) ×2 IMPLANT
CUFF TOURN SGL QUICK 18X4 (TOURNIQUET CUFF) IMPLANT
CUFF TOURN SGL QUICK 24 (TOURNIQUET CUFF)
CUFF TOURN SGL QUICK 34 (TOURNIQUET CUFF) ×2
CUFF TRNQT CYL 24X4X16.5-23 (TOURNIQUET CUFF) IMPLANT
CUFF TRNQT CYL 34X4X40X1 (TOURNIQUET CUFF) ×1 IMPLANT
DRAPE FLUOR MINI C-ARM 54X84 (DRAPES) ×2 IMPLANT
DRAPE INCISE IOBAN 66X45 STRL (DRAPES) ×2 IMPLANT
DRAPE U-SHAPE 47X51 STRL (DRAPES) ×2 IMPLANT
DRSG TEGADERM 2-3/8X2-3/4 SM (GAUZE/BANDAGES/DRESSINGS) ×2 IMPLANT
DURAPREP 26ML APPLICATOR (WOUND CARE) ×2 IMPLANT
ELECT REM PT RETURN 9FT ADLT (ELECTROSURGICAL) ×2
ELECTRODE REM PT RTRN 9FT ADLT (ELECTROSURGICAL) ×1 IMPLANT
GAUZE 4X4 16PLY ~~LOC~~+RFID DBL (SPONGE) ×2 IMPLANT
GAUZE SPONGE 4X4 12PLY STRL (GAUZE/BANDAGES/DRESSINGS) ×2 IMPLANT
GAUZE XEROFORM 1X8 LF (GAUZE/BANDAGES/DRESSINGS) ×2 IMPLANT
GLOVE SURG ORTHO LTX SZ9 (GLOVE) ×2 IMPLANT
GLOVE SURG UNDER POLY LF SZ9 (GLOVE) ×2 IMPLANT
GOWN STRL REUS TWL 2XL XL LVL4 (GOWN DISPOSABLE) ×2 IMPLANT
GOWN STRL REUS W/ TWL LRG LVL3 (GOWN DISPOSABLE) ×1 IMPLANT
GOWN STRL REUS W/TWL LRG LVL3 (GOWN DISPOSABLE) ×2
KIT TURNOVER KIT A (KITS) ×2 IMPLANT
LABEL OR SOLS (LABEL) ×2 IMPLANT
MANIFOLD NEPTUNE II (INSTRUMENTS) ×2 IMPLANT
NS IRRIG 1000ML POUR BTL (IV SOLUTION) ×2 IMPLANT
NS IRRIG 500ML POUR BTL (IV SOLUTION) ×2 IMPLANT
PACK EXTREMITY ARMC (MISCELLANEOUS) ×2 IMPLANT
PAD ABD DERMACEA PRESS 5X9 (GAUZE/BANDAGES/DRESSINGS) ×2 IMPLANT
PAD CAST CTTN 4X4 STRL (SOFTGOODS) ×1 IMPLANT
PADDING CAST COTTON 4X4 STRL (SOFTGOODS) ×2
SPLINT CAST 1 STEP 4X30 (MISCELLANEOUS) ×2 IMPLANT
SPONGE T-LAP 18X18 ~~LOC~~+RFID (SPONGE) ×2 IMPLANT
STAPLER SKIN PROX 35W (STAPLE) ×2 IMPLANT
STOCKINETTE IMPERVIOUS 9X36 MD (GAUZE/BANDAGES/DRESSINGS) IMPLANT
STOCKINETTE STRL 6IN 960660 (GAUZE/BANDAGES/DRESSINGS) ×2 IMPLANT
STRIP CLOSURE SKIN 1/2X4 (GAUZE/BANDAGES/DRESSINGS) ×2 IMPLANT
SUT ETHILON 4 0 PS 2 18 (SUTURE) ×2 IMPLANT
SUT PDS PLUS 2 (SUTURE) ×1
SUT PDS PLUS AB 2-0 CT-1 (SUTURE) ×1 IMPLANT
SUT VIC AB 2-0 SH 27 (SUTURE) ×2
SUT VIC AB 2-0 SH 27XBRD (SUTURE) ×1 IMPLANT
SYR 30ML LL (SYRINGE) ×2 IMPLANT
TAPE PAPER 2X10 WHT MICROPORE (GAUZE/BANDAGES/DRESSINGS) ×2 IMPLANT

## 2021-03-04 NOTE — Anesthesia Preprocedure Evaluation (Addendum)
Anesthesia Evaluation  Patient identified by MRN, date of birth, ID band Patient awake    Reviewed: Allergy & Precautions, NPO status , Patient's Chart, lab work & pertinent test results  History of Anesthesia Complications Negative for: history of anesthetic complications  Airway Mallampati: III  TM Distance: >3 FB Neck ROM: Full    Dental  (+) Upper Dentures, Lower Dentures   Pulmonary neg sleep apnea, COPD, former smoker,    breath sounds clear to auscultation- rhonchi (-) wheezing      Cardiovascular Exercise Tolerance: Poor (-) hypertension(-) CAD, (-) Past MI, (-) Cardiac Stents and (-) CABG + dysrhythmias  Rhythm:Regular Rate:Normal - Systolic murmurs and - Diastolic murmurs METs>4   Neuro/Psych negative neurological ROS  negative psych ROS   GI/Hepatic Neg liver ROS, GERD  ,  Endo/Other  negative endocrine ROSneg diabetes  Renal/GU negative Renal ROS     Musculoskeletal negative musculoskeletal ROS (+)   Abdominal (+) + obese,   Peds  Hematology  (+) Blood dyscrasia, anemia ,   Anesthesia Other Findings Past Medical History: No date: Anemia in her 49s: Cancer ([place])     Comment:  basal cell carcinoma 2012: Colon polyps No date: COPD (chronic obstructive pulmonary disease) ([place]) 2013: Cystitis No date: Dysrhythmia No date: GERD (gastroesophageal reflux disease) No date: Heart murmur No date: Hyperlipidemia   Reproductive/Obstetrics                            Anesthesia Physical  Anesthesia Plan  ASA: 3  Anesthesia Plan: General   Post-op Pain Management:    Induction: Intravenous  PONV Risk Score and Plan: 2 and Propofol infusion  Airway Management Planned: LMA  Additional Equipment:   Intra-op Plan:   Post-operative Plan:   Informed Consent: I have reviewed the patients History and Physical, chart, labs and discussed the procedure including the risks,  benefits and alternatives for the proposed anesthesia with the patient or authorized representative who has indicated his/her understanding and acceptance.     Dental advisory given  Plan Discussed with: CRNA, Anesthesiologist and Surgeon  Anesthesia Plan Comments:        Anesthesia Quick Evaluation

## 2021-03-04 NOTE — Discharge Instructions (Signed)
AMBULATORY SURGERY  ?DISCHARGE INSTRUCTIONS ? ? ?The drugs that you were given will stay in your system until tomorrow so for the next 24 hours you should not: ? ?Drive an automobile ?Make any legal decisions ?Drink any alcoholic beverage ? ? ?You may resume regular meals tomorrow.  Today it is better to start with liquids and gradually work up to solid foods. ? ?You may eat anything you prefer, but it is better to start with liquids, then soup and crackers, and gradually work up to solid foods. ? ? ?Please notify your doctor immediately if you have any unusual bleeding, trouble breathing, redness and pain at the surgery site, drainage, fever, or pain not relieved by medication. ? ? ? ?Additional Instructions: ? ? ? ?Please contact your physician with any problems or Same Day Surgery at [phone], Monday through Friday 6 am to 4 pm, or [facility] Main number at [phone].  ?

## 2021-03-04 NOTE — Transfer of Care (Signed)
Immediate Anesthesia Transfer of Care Note  Patient: Renee Meyer  Procedure(s) Performed: HARDWARE REMOVAL-RIGHT ANKLE (Right: Ankle)  Patient Location: PACU  Anesthesia Type:General  Level of Consciousness: drowsy and patient cooperative  Airway & Oxygen Therapy: Patient Spontanous Breathing and Patient connected to face mask oxygen  Post-op Assessment: Report given to RN and Post -op Vital signs reviewed and stable  Post vital signs: Reviewed and stable  Last Vitals:  Vitals Value Taken Time  BP 155/83 03/04/21 1327  Temp    Pulse 70 03/04/21 1330  Resp 13 03/04/21 1330  SpO2 100 % 03/04/21 1330  Vitals shown include unvalidated device data.  Last Pain:  Vitals:   03/04/21 1025  TempSrc: Oral  PainSc: 0-No pain         Complications: No notable events documented.

## 2021-03-04 NOTE — H&P (Signed)
PREOPERATIVE H&P  Chief Complaint: pain with orthopaedic prosthetic device  HPI: Renee Meyer is a 80 y.o. female who presents for preoperative history and physical with a diagnosis of chronic pain, swelling and drainage from her lateral incision.  Patient underwent an ORIF of a  left bimalleolar ankle fracture by me in 2017.   Patient has radiographic evidence of bone healing of the lateral and medial malleoli.  I suspect a chronic lateral wound infection and recommended her hardware be removed so the infection could be eradicated.   Past Medical History:  Diagnosis Date   Anemia    Cancer ([place]) in her 17s   basal cell carcinoma   Colon polyps 2012   COPD (chronic obstructive pulmonary disease) ([place])    Cystitis 2013   Dyspnea    Dysrhythmia    GERD (gastroesophageal reflux disease)    Heart murmur    Hyperlipidemia    Pneumonia    Past Surgical History:  Procedure Laterality Date   ABDOMINAL HYSTERECTOMY     BREAST SURGERY Right 07/31/2013   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   CATARACT EXTRACTION, BILATERAL     CESAREAN SECTION     COLONOSCOPY  2012   Dr Vira Agar   COLONOSCOPY WITH PROPOFOL N/A 09/21/2017   Procedure: COLONOSCOPY WITH PROPOFOL;  Surgeon: Toledo, Benay Pike, MD;  Location: [facility] ENDOSCOPY;  Service: Gastroenterology;  Laterality: N/A;   FRACTURE SURGERY Left 2013   arm   ORIF ANKLE FRACTURE Right 08/06/2016   Procedure: OPEN REDUCTION INTERNAL FIXATION (ORIF) ANKLE FRACTURE;  Surgeon: Thornton Park, MD;  Location: [facility] ORS;  Service: Orthopedics;  Laterality: Right;  SMALL FRAGMENT SET SYNTHES 4.0MM CANNULATED SCREW SET      Social History   Socioeconomic History   Marital status: Married    Spouse name: Not on file   Number of children: Not on file   Years of education: Not on file   Highest education level: Not on file  Occupational History   Not on file  Tobacco Use   Smoking status: Former    Types: Cigarettes    Quit date:  08/05/1996    Years since quitting: 24.5   Smokeless tobacco: Never  Substance and Sexual Activity   Alcohol use: No   Drug use: No   Sexual activity: Not on file  Other Topics Concern   Not on file  Social History Narrative   Not on file   Social Determinants of Health   Financial Resource Strain: Not on file  Food Insecurity: Not on file  Transportation Needs: Not on file  Physical Activity: Not on file  Stress: Not on file  Social Connections: Not on file   Family History  Problem Relation Age of Onset   Colon cancer Sister    Allergies  Allergen Reactions   Simvastatin Swelling    Other reaction(s): Muscle Pain, Other (See Comments)   Sulfacetamide Sodium Hives   Isosorbide Nitrate     Other reaction(s): Headache, Other (See Comments)   Amoxicillin Hives and Itching   Bisoprolol Hives   Clindamycin/Lincomycin Hives   Codeine Other (See Comments)    headache Other reaction(s): Headache, Other (See Comments) headache    Keflex [Cephalexin] Hives   Penicillins Hives   Sulfa Antibiotics Hives   Prior to Admission medications   Medication Sig Start Date End Date Taking? Authorizing Provider  acetaminophen (TYLENOL) 500 MG tablet Take 1,000 mg by mouth every 6 (six) hours as needed for mild pain.  Yes [provider]  Calcium Carbonate+Vitamin D 600-200 MG-UNIT TABS Take 1 tablet by mouth daily.   Yes [provider]  Cholecalciferol (D3 DOTS) 50 MCG (2000 UT) TBDP Take 2,000 Units by mouth daily.   Yes [provider]  furosemide (LASIX) 20 MG tablet Take 40 mg by mouth daily.   Yes [provider]  GLUCOSAMINE-CALCIUM-VIT D PO Take 1 tablet by mouth daily.   Yes [provider]  loratadine (CLARITIN) 10 MG tablet Take 10 mg by mouth daily.   Yes [provider]  Magnesium Oxide 250 MG TABS Take 250 mg by mouth daily.   Yes [provider]  meclizine (ANTIVERT) 25 MG tablet Take 25 mg by mouth 3  (three) times daily as needed for dizziness. 07/17/13  Yes [provider]  metolazone (ZAROXOLYN) 2.5 MG tablet Take 2.5 mg by mouth 2 (two) times a week.   Yes [provider]  metoprolol succinate (TOPROL-XL) 50 MG 24 hr tablet Take 25 mg by mouth in the morning and at bedtime. 07/14/13  Yes [provider]  omeprazole (PRILOSEC) 20 MG capsule Take 20 mg by mouth daily.   Yes [provider]  potassium chloride (K-DUR,KLOR-CON) 10 MEQ tablet Take 10 mEq by mouth 2 (two) times daily.   Yes [provider]  pravastatin (PRAVACHOL) 40 MG tablet Take 40 mg by mouth daily. 05/15/13  Yes [provider]  vitamin B-12 (CYANOCOBALAMIN) 1000 MCG tablet Take 1,000 mcg by mouth daily.   Yes [provider]     Positive ROS: All other systems have been reviewed and were otherwise negative with the exception of those mentioned in the HPI and as above.  Physical Exam: General: Alert, no acute distress Cardiovascular: Regular rate and rhythm, no murmurs rubs or gallops.  No pedal edema Respiratory: Clear to auscultation bilaterally, no wheezes rales or rhonchi. No cyanosis, no use of accessory musculature GI: No organomegaly, abdomen is soft and non-tender nondistended with positive bowel sounds. Skin: Skin intact, no lesions within the operative field. Neurologic: Sensation intact distally Psychiatric: Patient is competent for consent with normal mood and affect Lymphatic: No cervical lymphadenopathy  MUSCULOSKELETAL: Right ankle: Patient has a small area of persistent drainage at the junction of the distal middle third of her lateral incision.  She has lower extremity edema and erythema consistent with cellulitis.  Patient has intact sensation light touch in the right foot.  She has palpable pedal pulses and can dorsiflex and plantarflex her ankle.  There is no exposed hardware.  Assessment: Chronic infection of lateral wound, right  ankle  Plan: Plan for Procedure(s): HARDWARE REMOVAL-RIGHT ANKLE  I reviewed the details of the operation as well as the postoperative course with the patient.  I discussed the risks and benefits of surgery. The risks include but are not limited to persistent infection, swelling or pain, bleeding, nerve or blood vessel injury, joint stiffness or loss of motion, persistent pain, weakness or instability, fracture,and the need for further surgery. Medical risks include but are not limited to DVT and pulmonary embolism, myocardial infarction, stroke, pneumonia, respiratory failure and death. Patient understood these risks and wished to proceed.   I reviewed the patient's x-rays, CT scan and labs in preparation for this case.  Thornton Park, MD   03/04/2021 11:37 AM

## 2021-03-04 NOTE — Brief Op Note (Signed)
03/04/2021  1:48 PM  PATIENT:  Renee Meyer  80 y.o. female  PRE-OPERATIVE DIAGNOSIS: Chronic draining right lateral incision with associated pain and swelling, with possible chronic low-grade infection  POST-OPERATIVE DIAGNOSIS: Same  PROCEDURE:  Procedure(s): HARDWARE REMOVAL-RIGHT ANKLE (Right)  SURGEON:  Surgeon(s) and Role:    Thornton Park, MD - Primary  ANESTHESIA:   local and general  EBL:  10 mL   LOCAL MEDICATIONS USED:  0.25 % MARCAINE plain Amount: 20 ml  COUNTS:  YES  TOURNIQUET:   Total Tourniquet Time Documented: Thigh (Right) - 70 minutes Total: Thigh (Right) - 70 minutes   DICTATION: .Viviann Spare Dictation  PLAN OF CARE: Discharge to home after PACU  PATIENT DISPOSITION:  PACU - hemodynamically stable.    PROCEDURE:  Patient was met in the preoperative area.  Her right leg was signed according the hospital's correct site of surgery protocol.  A preop history and physical was performed at the bedside.  Patient was brought to the operating room where she was placed supine on the operative table.  She underwent general anesthesia with an LMA.  Patient had a bump placed on her right hip.  A tourniquet was applied to her right thigh.  Patient was prepped and draped in a sterile fashion.  A timeout was performed to verify the patient's name, date of birth, medical record number, correct site of surgery and correct procedure to be performed.  Once all in attendance were agreement the case began.  Patient received 1 g of vancomycin prior to the onset of the case.  Patient had her right lower extremity exsanguinated with an Esmarch.  The tourniquet was inflated to 275 mmHg for 70 minutes.  There was no active drainage from the lateral incision.  The lateral incision was reopened.  No abscess or fluid was encountered.  There was no purulent fluid seen.  Therefore no cultures were sent.  Patient had her fibula exposed using electrocautery and Metzenbaum scissor and  forceps.  The lateral plate was completely enveloped in callus.  Therefore a bur was used to expose the lateral plate and screws.  Each screw was removed by hand.  All screws were removed in their entirety as was the one third tubular plate.  A curette was used to remove any soft tissue from the screw holes.  The lateral incision was copiously irrigated.  The lateral malleolus fracture was well-healed.  There is no signs of osteomyelitis or abscess.  The attention was then turned to the medial side.  The previous medial incision was reopened.  Soft tissue again was dissected using electrocautery as well as Metzenbaum scissor and forcep.  240 cannulated screws were identified and removed by hand.  The more anterior screw had a washer which was also removed.  Final FluoroScan images were taken of the ankle confirming that all hardware had been successfully removed and that there was no evidence of fracture dislocation or subluxation of the ankle.  The medial lateral incisions were then again copiously irrigated.  Both wounds were closed with 2-0 PDS for subcutaneous closure and staples were used for approximation of the skin.  Patient was injected with quarter percent Marcaine plain in both the medial and lateral incision and the ankle joint to help with postoperative pain control.  A dry sterile dressing was applied along with a walking boot.  Patient was brought to PACU in stable condition.  I scrubbed and present for the entire case.  The sharp, sponge and instrument  counts were correct at the conclusion of the case.

## 2021-03-04 NOTE — Anesthesia Procedure Notes (Signed)
Procedure Name: LMA Insertion Date/Time: 03/04/2021 11:49 AM Performed by: Johnna Acosta, CRNA Pre-anesthesia Checklist: Patient identified, Patient being monitored, Timeout performed, Emergency Drugs available and Suction available Patient Re-evaluated:Patient Re-evaluated prior to induction Oxygen Delivery Method: Circle system utilized Preoxygenation: Pre-oxygenation with 100% oxygen Induction Type: IV induction Ventilation: Mask ventilation without difficulty LMA: LMA inserted LMA Size: 4.0 Tube type: Oral Number of attempts: 1 Placement Confirmation: positive ETCO2 and breath sounds checked- equal and bilateral Tube secured with: Tape Dental Injury: Teeth and Oropharynx as per pre-operative assessment

## 2021-03-05 ENCOUNTER — Encounter: Payer: Self-pay | Admitting: Orthopedic Surgery

## 2021-03-06 NOTE — Anesthesia Postprocedure Evaluation (Signed)
Anesthesia Post Note  Patient: Renee Meyer  Procedure(s) Performed: HARDWARE REMOVAL-RIGHT ANKLE (Right: Ankle)  Patient location during evaluation: PACU Anesthesia Type: General Level of consciousness: awake and alert Pain management: pain level controlled Vital Signs Assessment: post-procedure vital signs reviewed and stable Respiratory status: spontaneous breathing, nonlabored ventilation and respiratory function stable Cardiovascular status: blood pressure returned to baseline and stable Postop Assessment: no apparent nausea or vomiting Anesthetic complications: no   No notable events documented.   Last Vitals:  Vitals:   03/04/21 1513 03/04/21 1519  BP: (!) 188/67 (!) 167/65  Pulse: 73   Resp: 16   Temp: 36.9 C   SpO2: 97%     Last Pain:  Vitals:   03/05/21 0945  TempSrc:   PainSc: [place]

## 2021-03-14 DIAGNOSIS — S8291XA Unspecified fracture of right lower leg, initial encounter for closed fracture: Secondary | ICD-10-CM | POA: Diagnosis not present

## 2021-03-16 NOTE — Op Note (Signed)
03/04/2021   1:48 PM   PATIENT:  Renee Meyer  80 y.o. female   PRE-OPERATIVE DIAGNOSIS: Chronic draining right lateral incision with associated pain and swelling, with possible chronic low-grade infection   POST-OPERATIVE DIAGNOSIS: Same   PROCEDURE:  Procedure(s): HARDWARE REMOVAL-RIGHT ANKLE (Right)   SURGEON:  Surgeon(s) and Role:    Thornton Park, MD - Primary   ANESTHESIA:   local and general   EBL:  10 mL    LOCAL MEDICATIONS USED:  0.25 % MARCAINE plain Amount: 20 ml   COUNTS:  YES   TOURNIQUET:   Total Tourniquet Time Documented: Thigh (Right) - 70 minutes Total: Thigh (Right) - 70 minutes     DICTATION: .Viviann Spare Dictation   PLAN OF CARE: Discharge to home after PACU   PATIENT DISPOSITION:  PACU - hemodynamically stable.     PROCEDURE:   Patient was met in the preoperative area.  Her right leg was signed according the hospital's correct site of surgery protocol.  A preop history and physical was performed at the bedside.  Patient was brought to the operating room where she was placed supine on the operative table.  She underwent general anesthesia with an LMA.  Patient had a bump placed on her right hip.  A tourniquet was applied to her right thigh.  Patient was prepped and draped in a sterile fashion.   A timeout was performed to verify the patient's name, date of birth, medical record number, correct site of surgery and correct procedure to be performed.  Once all in attendance were agreement the case began.  Patient received 1 g of vancomycin prior to the onset of the case.   Patient had her right lower extremity exsanguinated with an Esmarch.  The tourniquet was inflated to 275 mmHg for 70 minutes.  There was no active drainage from the lateral incision.  The lateral incision was reopened.  No abscess or fluid was encountered.  There was no purulent fluid seen.  Therefore no cultures were sent.   Patient had her fibula exposed using electrocautery and  Metzenbaum scissor and forceps.  The lateral plate was completely enveloped in callus.  Therefore a bur was used to expose the lateral plate and screws.  Each screw was removed by hand.  All screws were removed in their entirety as was the one third tubular plate.  A curette was used to remove any soft tissue from the screw holes.  The lateral incision was copiously irrigated.  The lateral malleolus fracture was well-healed.  There is no signs of osteomyelitis or abscess.   The attention was then turned to the medial side.  The previous medial incision was reopened.  Soft tissue again was dissected using electrocautery as well as Metzenbaum scissor and forcep.  240 cannulated screws were identified and removed by hand.  The more anterior screw had a washer which was also removed.  Final FluoroScan images were taken of the ankle confirming that all hardware had been successfully removed and that there was no evidence of fracture dislocation or subluxation of the ankle.  The medial lateral incisions were then again copiously irrigated.  Both wounds were closed with 2-0 PDS for subcutaneous closure and staples were used for approximation of the skin.   Patient was injected with quarter percent Marcaine plain in both the medial and lateral incision and the ankle joint to help with postoperative pain control.  A dry sterile dressing was applied along with a walking boot.  Patient  was brought to PACU in stable condition.  I scrubbed and present for the entire case.  The sharp, sponge and instrument counts were correct at the conclusion of the case.

## 2021-03-27 DIAGNOSIS — D2262 Melanocytic nevi of left upper limb, including shoulder: Secondary | ICD-10-CM | POA: Diagnosis not present

## 2021-03-27 DIAGNOSIS — L565 Disseminated superficial actinic porokeratosis (DSAP): Secondary | ICD-10-CM | POA: Diagnosis not present

## 2021-03-27 DIAGNOSIS — D225 Melanocytic nevi of trunk: Secondary | ICD-10-CM | POA: Diagnosis not present

## 2021-03-27 DIAGNOSIS — D2271 Melanocytic nevi of right lower limb, including hip: Secondary | ICD-10-CM | POA: Diagnosis not present

## 2021-03-27 DIAGNOSIS — Z85828 Personal history of other malignant neoplasm of skin: Secondary | ICD-10-CM | POA: Diagnosis not present

## 2021-05-13 DIAGNOSIS — S82891A Other fracture of right lower leg, initial encounter for closed fracture: Secondary | ICD-10-CM | POA: Diagnosis not present

## 2021-05-13 DIAGNOSIS — M818 Other osteoporosis without current pathological fracture: Secondary | ICD-10-CM | POA: Diagnosis not present

## 2021-06-26 DIAGNOSIS — J40 Bronchitis, not specified as acute or chronic: Secondary | ICD-10-CM | POA: Diagnosis not present

## 2021-06-26 DIAGNOSIS — J449 Chronic obstructive pulmonary disease, unspecified: Secondary | ICD-10-CM | POA: Diagnosis not present

## 2021-07-23 DIAGNOSIS — E538 Deficiency of other specified B group vitamins: Secondary | ICD-10-CM | POA: Diagnosis not present

## 2021-07-23 DIAGNOSIS — E782 Mixed hyperlipidemia: Secondary | ICD-10-CM | POA: Diagnosis not present

## 2021-07-23 DIAGNOSIS — J029 Acute pharyngitis, unspecified: Secondary | ICD-10-CM | POA: Diagnosis not present

## 2021-07-23 DIAGNOSIS — Z03818 Encounter for observation for suspected exposure to other biological agents ruled out: Secondary | ICD-10-CM | POA: Diagnosis not present

## 2021-07-30 DIAGNOSIS — D500 Iron deficiency anemia secondary to blood loss (chronic): Secondary | ICD-10-CM | POA: Diagnosis not present

## 2021-07-30 DIAGNOSIS — E782 Mixed hyperlipidemia: Secondary | ICD-10-CM | POA: Diagnosis not present

## 2021-07-30 DIAGNOSIS — J449 Chronic obstructive pulmonary disease, unspecified: Secondary | ICD-10-CM | POA: Diagnosis not present

## 2021-07-30 DIAGNOSIS — I2781 Cor pulmonale (chronic): Secondary | ICD-10-CM | POA: Diagnosis not present

## 2021-07-30 DIAGNOSIS — E538 Deficiency of other specified B group vitamins: Secondary | ICD-10-CM | POA: Diagnosis not present

## 2021-10-11 DIAGNOSIS — J40 Bronchitis, not specified as acute or chronic: Secondary | ICD-10-CM | POA: Diagnosis not present

## 2021-10-11 DIAGNOSIS — Z03818 Encounter for observation for suspected exposure to other biological agents ruled out: Secondary | ICD-10-CM | POA: Diagnosis not present

## 2021-12-01 ENCOUNTER — Ambulatory Visit
Admission: RE | Admit: 2021-12-01 | Discharge: 2021-12-01 | Disposition: A | Discharge status: Home / Self Care | Payer: PPO | Source: Ambulatory Visit | Attending: Internal Medicine | Admitting: Internal Medicine

## 2021-12-01 ENCOUNTER — Other Ambulatory Visit: Payer: Self-pay | Admitting: Internal Medicine

## 2021-12-01 DIAGNOSIS — S0990XA Unspecified injury of head, initial encounter: Secondary | ICD-10-CM

## 2021-12-01 DIAGNOSIS — R002 Palpitations: Secondary | ICD-10-CM | POA: Diagnosis not present

## 2021-12-01 DIAGNOSIS — J40 Bronchitis, not specified as acute or chronic: Secondary | ICD-10-CM | POA: Diagnosis not present

## 2021-12-08 ENCOUNTER — Ambulatory Visit
Admission: RE | Admit: 2021-12-08 | Discharge: 2021-12-08 | Disposition: A | Discharge status: Home / Self Care | Payer: PPO | Source: Ambulatory Visit | Attending: Internal Medicine | Admitting: Internal Medicine

## 2021-12-08 ENCOUNTER — Other Ambulatory Visit (HOSPITAL_COMMUNITY): Payer: Self-pay | Admitting: Internal Medicine

## 2021-12-08 ENCOUNTER — Other Ambulatory Visit: Payer: Self-pay | Admitting: Internal Medicine

## 2021-12-08 DIAGNOSIS — I639 Cerebral infarction, unspecified: Secondary | ICD-10-CM | POA: Diagnosis not present

## 2021-12-08 DIAGNOSIS — H539 Unspecified visual disturbance: Secondary | ICD-10-CM

## 2021-12-08 DIAGNOSIS — R42 Dizziness and giddiness: Secondary | ICD-10-CM | POA: Diagnosis not present

## 2021-12-08 MED ORDER — GADOBUTROL 1 MMOL/ML IV SOLN
10.0000 mL | Freq: Once | INTRAVENOUS | Status: AC | PRN
  Administered 2021-12-08: 10 mL via INTRAVENOUS

## 2021-12-17 DIAGNOSIS — F0781 Postconcussional syndrome: Secondary | ICD-10-CM | POA: Diagnosis not present

## 2021-12-17 DIAGNOSIS — R569 Unspecified convulsions: Secondary | ICD-10-CM | POA: Diagnosis not present

## 2021-12-23 DIAGNOSIS — R569 Unspecified convulsions: Secondary | ICD-10-CM | POA: Diagnosis not present

## 2021-12-23 DIAGNOSIS — M76899 Other specified enthesopathies of unspecified lower limb, excluding foot: Secondary | ICD-10-CM | POA: Diagnosis not present

## 2021-12-23 DIAGNOSIS — F0781 Postconcussional syndrome: Secondary | ICD-10-CM | POA: Diagnosis not present

## 2022-01-16 DIAGNOSIS — M25562 Pain in left knee: Secondary | ICD-10-CM | POA: Diagnosis not present

## 2022-01-28 DIAGNOSIS — E538 Deficiency of other specified B group vitamins: Secondary | ICD-10-CM | POA: Diagnosis not present

## 2022-01-28 DIAGNOSIS — E782 Mixed hyperlipidemia: Secondary | ICD-10-CM | POA: Diagnosis not present

## 2022-01-31 DIAGNOSIS — R569 Unspecified convulsions: Secondary | ICD-10-CM | POA: Diagnosis not present

## 2022-02-04 DIAGNOSIS — J449 Chronic obstructive pulmonary disease, unspecified: Secondary | ICD-10-CM | POA: Diagnosis not present

## 2022-02-04 DIAGNOSIS — E538 Deficiency of other specified B group vitamins: Secondary | ICD-10-CM | POA: Diagnosis not present

## 2022-02-04 DIAGNOSIS — Z0000 Encounter for general adult medical examination without abnormal findings: Secondary | ICD-10-CM | POA: Diagnosis not present

## 2022-02-04 DIAGNOSIS — I2781 Cor pulmonale (chronic): Secondary | ICD-10-CM | POA: Diagnosis not present

## 2022-02-04 DIAGNOSIS — R739 Hyperglycemia, unspecified: Secondary | ICD-10-CM | POA: Diagnosis not present

## 2022-02-10 ENCOUNTER — Emergency Department: Payer: PPO

## 2022-02-10 ENCOUNTER — Observation Stay
Admission: EM | Admit: 2022-02-10 | Discharge: 2022-02-11 | Disposition: A | Discharge status: Home / Self Care | Payer: PPO | Attending: Internal Medicine | Admitting: Internal Medicine

## 2022-02-10 ENCOUNTER — Other Ambulatory Visit: Payer: Self-pay

## 2022-02-10 DIAGNOSIS — Y92414 Local residential or business street as the place of occurrence of the external cause: Secondary | ICD-10-CM | POA: Diagnosis not present

## 2022-02-10 DIAGNOSIS — Z85828 Personal history of other malignant neoplasm of skin: Secondary | ICD-10-CM | POA: Insufficient documentation

## 2022-02-10 DIAGNOSIS — R600 Localized edema: Secondary | ICD-10-CM | POA: Insufficient documentation

## 2022-02-10 DIAGNOSIS — Z20822 Contact with and (suspected) exposure to covid-19: Secondary | ICD-10-CM | POA: Diagnosis not present

## 2022-02-10 DIAGNOSIS — E782 Mixed hyperlipidemia: Secondary | ICD-10-CM | POA: Insufficient documentation

## 2022-02-10 DIAGNOSIS — E538 Deficiency of other specified B group vitamins: Secondary | ICD-10-CM | POA: Insufficient documentation

## 2022-02-10 DIAGNOSIS — Z79899 Other long term (current) drug therapy: Secondary | ICD-10-CM | POA: Insufficient documentation

## 2022-02-10 DIAGNOSIS — I700 Atherosclerosis of aorta: Secondary | ICD-10-CM | POA: Diagnosis not present

## 2022-02-10 DIAGNOSIS — R519 Headache, unspecified: Secondary | ICD-10-CM | POA: Insufficient documentation

## 2022-02-10 DIAGNOSIS — I442 Atrioventricular block, complete: Secondary | ICD-10-CM | POA: Diagnosis not present

## 2022-02-10 DIAGNOSIS — I10 Essential (primary) hypertension: Secondary | ICD-10-CM

## 2022-02-10 DIAGNOSIS — I441 Atrioventricular block, second degree: Secondary | ICD-10-CM | POA: Insufficient documentation

## 2022-02-10 DIAGNOSIS — Z7951 Long term (current) use of inhaled steroids: Secondary | ICD-10-CM | POA: Insufficient documentation

## 2022-02-10 DIAGNOSIS — I2781 Cor pulmonale (chronic): Secondary | ICD-10-CM | POA: Diagnosis not present

## 2022-02-10 DIAGNOSIS — R531 Weakness: Secondary | ICD-10-CM | POA: Insufficient documentation

## 2022-02-10 DIAGNOSIS — R42 Dizziness and giddiness: Secondary | ICD-10-CM | POA: Diagnosis not present

## 2022-02-10 DIAGNOSIS — M818 Other osteoporosis without current pathological fracture: Secondary | ICD-10-CM | POA: Insufficient documentation

## 2022-02-10 DIAGNOSIS — Z6837 Body mass index (BMI) 37.0-37.9, adult: Secondary | ICD-10-CM | POA: Diagnosis not present

## 2022-02-10 DIAGNOSIS — Z9181 History of falling: Secondary | ICD-10-CM | POA: Diagnosis not present

## 2022-02-10 DIAGNOSIS — K219 Gastro-esophageal reflux disease without esophagitis: Secondary | ICD-10-CM | POA: Diagnosis not present

## 2022-02-10 DIAGNOSIS — R55 Syncope and collapse: Principal | ICD-10-CM | POA: Diagnosis present

## 2022-02-10 DIAGNOSIS — R001 Bradycardia, unspecified: Secondary | ICD-10-CM

## 2022-02-10 DIAGNOSIS — I129 Hypertensive chronic kidney disease with stage 1 through stage 4 chronic kidney disease, or unspecified chronic kidney disease: Secondary | ICD-10-CM | POA: Insufficient documentation

## 2022-02-10 DIAGNOSIS — Z87891 Personal history of nicotine dependence: Secondary | ICD-10-CM | POA: Insufficient documentation

## 2022-02-10 DIAGNOSIS — I493 Ventricular premature depolarization: Secondary | ICD-10-CM | POA: Diagnosis not present

## 2022-02-10 DIAGNOSIS — N1831 Chronic kidney disease, stage 3a: Secondary | ICD-10-CM | POA: Diagnosis not present

## 2022-02-10 DIAGNOSIS — D649 Anemia, unspecified: Secondary | ICD-10-CM | POA: Insufficient documentation

## 2022-02-10 DIAGNOSIS — R5381 Other malaise: Secondary | ICD-10-CM | POA: Insufficient documentation

## 2022-02-10 DIAGNOSIS — Z8709 Personal history of other diseases of the respiratory system: Secondary | ICD-10-CM | POA: Insufficient documentation

## 2022-02-10 DIAGNOSIS — Z859 Personal history of malignant neoplasm, unspecified: Secondary | ICD-10-CM | POA: Insufficient documentation

## 2022-02-10 DIAGNOSIS — R5383 Other fatigue: Secondary | ICD-10-CM | POA: Insufficient documentation

## 2022-02-10 DIAGNOSIS — J9811 Atelectasis: Secondary | ICD-10-CM | POA: Diagnosis not present

## 2022-02-10 DIAGNOSIS — R053 Chronic cough: Secondary | ICD-10-CM | POA: Insufficient documentation

## 2022-02-10 DIAGNOSIS — J986 Disorders of diaphragm: Secondary | ICD-10-CM | POA: Insufficient documentation

## 2022-02-10 DIAGNOSIS — J449 Chronic obstructive pulmonary disease, unspecified: Secondary | ICD-10-CM | POA: Diagnosis not present

## 2022-02-10 DIAGNOSIS — R011 Cardiac murmur, unspecified: Secondary | ICD-10-CM | POA: Insufficient documentation

## 2022-02-10 DIAGNOSIS — I495 Sick sinus syndrome: Secondary | ICD-10-CM | POA: Insufficient documentation

## 2022-02-10 LAB — BASIC METABOLIC PANEL
Anion gap: 9 (ref 5–15)
BUN: 20 mg/dL (ref 8–23)
CO2: 24 mmol/L (ref 22–32)
Calcium: 9.1 mg/dL (ref 8.9–10.3)
Chloride: 107 mmol/L (ref 98–111)
Creatinine, Ser: 1.08 mg/dL — ABNORMAL HIGH (ref 0.44–1.00)
GFR, Estimated: 53 mL/min — ABNORMAL LOW (ref >60)
Glucose, Bld: 93 mg/dL (ref 70–99)
Potassium: 4.6 mmol/L (ref 3.5–5.1)
Sodium: 140 mmol/L (ref 135–145)

## 2022-02-10 LAB — URINALYSIS, ROUTINE W REFLEX MICROSCOPIC
Bilirubin Urine: NEGATIVE
Glucose, UA: NEGATIVE mg/dL
Hgb urine dipstick: NEGATIVE
Ketones, ur: NEGATIVE mg/dL
Nitrite: NEGATIVE
Protein, ur: NEGATIVE mg/dL
Specific Gravity, Urine: 1.014 (ref 1.005–1.030)
pH: 7 (ref 5.0–8.0)

## 2022-02-10 LAB — CBC
HCT: 35.7 % — ABNORMAL LOW (ref 36.0–46.0)
Hemoglobin: 11 g/dL — ABNORMAL LOW (ref 12.0–15.0)
MCH: 28.1 pg (ref 26.0–34.0)
MCHC: 30.8 g/dL (ref 30.0–36.0)
MCV: 91.1 fL (ref 80.0–100.0)
Platelets: UNDETERMINED 10*3/uL (ref 150–400)
RBC: 3.92 MIL/uL (ref 3.87–5.11)
RDW: 13.2 % (ref 11.5–15.5)
WBC: 9.2 10*3/uL (ref 4.0–10.5)
nRBC: 0 % (ref 0.0–0.2)

## 2022-02-10 LAB — T4, FREE: Free T4: 1.06 ng/dL (ref 0.61–1.12)

## 2022-02-10 LAB — TSH: TSH: 1.605 u[IU]/mL (ref 0.350–4.500)

## 2022-02-10 LAB — SARS CORONAVIRUS 2 BY RT PCR: SARS Coronavirus 2 by RT PCR: NEGATIVE

## 2022-02-10 LAB — BRAIN NATRIURETIC PEPTIDE: B Natriuretic Peptide: 45.4 pg/mL (ref 0.0–100.0)

## 2022-02-10 LAB — TROPONIN I (HIGH SENSITIVITY): Troponin I (High Sensitivity): 3 ng/L (ref <18)

## 2022-02-10 LAB — MAGNESIUM: Magnesium: 2.4 mg/dL (ref 1.7–2.4)

## 2022-02-10 MED ORDER — ONDANSETRON HCL 4 MG PO TABS: 4.0000 mg | ORAL_TABLET | Freq: Four times a day (QID) | ORAL | Status: DC | PRN

## 2022-02-10 MED ORDER — VITAMIN D 25 MCG (1000 UNIT) PO TABS
2000.0000 [IU] | ORAL_TABLET | Freq: Every day | ORAL | Status: DC
Start: 2022-02-11 — End: 2022-02-11
  Administered 2022-02-11: 2000 [IU] via ORAL
  Filled 2022-02-10: qty 2

## 2022-02-10 MED ORDER — AMLODIPINE BESYLATE 5 MG PO TABS
5.0000 mg | ORAL_TABLET | Freq: Every day | ORAL | Status: DC
  Administered 2022-02-11: 5 mg via ORAL
  Filled 2022-02-10 (×2): qty 1

## 2022-02-10 MED ORDER — METOLAZONE 2.5 MG PO TABS: 2.5000 mg | ORAL_TABLET | ORAL | Status: DC

## 2022-02-10 MED ORDER — VITAMIN B-12 1000 MCG PO TABS
1000.0000 ug | ORAL_TABLET | Freq: Every day | ORAL | Status: DC
  Administered 2022-02-11: 1000 ug via ORAL
  Filled 2022-02-10: qty 1

## 2022-02-10 MED ORDER — PANTOPRAZOLE SODIUM 40 MG PO TBEC
40.0000 mg | DELAYED_RELEASE_TABLET | Freq: Every day | ORAL | Status: DC
  Administered 2022-02-11: 40 mg via ORAL
  Filled 2022-02-10: qty 1

## 2022-02-10 MED ORDER — ENOXAPARIN SODIUM 60 MG/0.6ML IJ SOSY
0.5000 mg/kg | PREFILLED_SYRINGE | Freq: Every day | INTRAMUSCULAR | Status: DC
  Administered 2022-02-10: 50 mg via SUBCUTANEOUS
  Filled 2022-02-10: qty 0.6

## 2022-02-10 MED ORDER — ACETAMINOPHEN 325 MG PO TABS
650.0000 mg | ORAL_TABLET | Freq: Four times a day (QID) | ORAL | Status: DC | PRN
  Administered 2022-02-11: 650 mg via ORAL
  Filled 2022-02-10: qty 2

## 2022-02-10 MED ORDER — ALBUTEROL SULFATE (2.5 MG/3ML) 0.083% IN NEBU: 3.0000 mL | INHALATION_SOLUTION | RESPIRATORY_TRACT | Status: DC | PRN

## 2022-02-10 MED ORDER — ONDANSETRON HCL 4 MG/2ML IJ SOLN: 4.0000 mg | Freq: Four times a day (QID) | INTRAMUSCULAR | Status: DC | PRN

## 2022-02-10 MED ORDER — HYDRALAZINE HCL 20 MG/ML IJ SOLN: 5.0000 mg | Freq: Four times a day (QID) | INTRAMUSCULAR | Status: DC | PRN

## 2022-02-10 MED ORDER — PRAVASTATIN SODIUM 20 MG PO TABS
40.0000 mg | ORAL_TABLET | Freq: Every day | ORAL | Status: DC
  Administered 2022-02-11: 40 mg via ORAL
  Filled 2022-02-10: qty 2

## 2022-02-10 MED ORDER — SODIUM CHLORIDE 0.9% FLUSH
3.0000 mL | Freq: Two times a day (BID) | INTRAVENOUS | Status: DC
Start: 2022-02-10 — End: 2022-02-11
  Administered 2022-02-10 – 2022-02-11 (×3): 3 mL via INTRAVENOUS

## 2022-02-10 MED ORDER — ATROPINE SULFATE 1 MG/10ML IJ SOSY
0.5000 mg | PREFILLED_SYRINGE | Freq: Once | INTRAMUSCULAR | Status: AC
  Administered 2022-02-11: 0.5 mg via INTRAVENOUS
  Filled 2022-02-10: qty 10

## 2022-02-10 MED ORDER — ACETAMINOPHEN 650 MG RE SUPP: 650.0000 mg | Freq: Four times a day (QID) | RECTAL | Status: DC | PRN

## 2022-02-10 MED ORDER — MECLIZINE HCL 25 MG PO TABS
25.0000 mg | ORAL_TABLET | Freq: Three times a day (TID) | ORAL | Status: DC | PRN
  Filled 2022-02-10: qty 1

## 2022-02-10 NOTE — Progress Notes (Addendum)
Talked with cardiology on call Harrington Challenger, says to send to unit if bed available so they can monitor her more closely. Ordered to give atropine. Reported to cardiology about her 3.24 sec pause and another pause 3.87 that came right behind it. HR dropped to 20s-30s. Nonsubstained while this nurse in the room. Patient felt like she was passing out. B/p 152/72, HR 73 after atropine given.   0028- New orders noted to transfer patient.

## 2022-02-10 NOTE — Progress Notes (Addendum)
Neomia Glass NP notified of patient describes as "passing out." Patient heart rate dropped down to 29, unsubstained but patient became "lightheaded and very dizzy, seeing white." For under a minute. Zoll pads checked and are on patiient at this time. Patient is alert and talking.  Notified K. Foust about giving her her meclizine and scheduled norvasc tonight.  Awaiting orders.  0007- Updated after talking with Harrington Challenger from cardiology [facility] on call.   0010Neomia Glass NP at bedside to see pt.  Patient says that she has the urge to pee but can not pee at this moment.

## 2022-02-10 NOTE — Progress Notes (Signed)
       CROSS COVER NOTE  NAME: Renee Meyer MRN: [id number] DOB : Mar 22, 1941    Date of Service   02/10/22  HPI/Events of Note   Secure chat received from nursing "FYI: patient's HR dropped to 29,nonsubstainted- symptomatic with dizzy spell. vitals 139/58, HR 67, resp 16 right now."  On arrival to 2A patient continued to have HR drop into the 20s with associated dizziness and feeling as though she would pass out. BP remains stable. HR is not sustained in the 20s and dizziness resolves when HR returns to baseline.   Interventions   Plan: Symptomatic Bradycardia, Stable BP 0.'5mg'$  [facility] Nursing informed on call Cardiologist Dr Harrington Challenger Transfer to CCU as Stepdown status for closer monitoring       This document was prepared using Dragon voice recognition software and may include unintentional dictation errors.  Neomia Glass DNP, MHA, FNP-BC Nurse Practitioner [facility] Pager [phone]
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
## 2022-02-10 NOTE — Assessment & Plan Note (Addendum)
-   Amlodipine 5 mg nightly resumed - Metoprolol succinate 50 mg tablets, patient takes 25 mg every morning and at bedtime has not been resumed - Avoiding beta-blockade at this time - Appreciate recommendation from cardiology

## 2022-02-10 NOTE — ED Notes (Signed)
Pt provided a dinner tray.  

## 2022-02-11 ENCOUNTER — Encounter (HOSPITAL_COMMUNITY): Payer: Self-pay | Admitting: Internal Medicine

## 2022-02-11 ENCOUNTER — Inpatient Hospital Stay (HOSPITAL_COMMUNITY)
Admission: AD | Admit: 2022-02-11 | Discharge: 2022-02-14 | DRG: 244 | Disposition: A | Discharge status: Home / Self Care | Payer: PPO | Source: Other Acute Inpatient Hospital | Attending: Internal Medicine | Admitting: Internal Medicine

## 2022-02-11 ENCOUNTER — Observation Stay (HOSPITAL_COMMUNITY)
Admit: 2022-02-11 | Discharge: 2022-02-11 | Disposition: A | Discharge status: Home / Self Care | Payer: PPO | Attending: Internal Medicine | Admitting: Internal Medicine

## 2022-02-11 DIAGNOSIS — E782 Mixed hyperlipidemia: Secondary | ICD-10-CM | POA: Diagnosis present

## 2022-02-11 DIAGNOSIS — Z90710 Acquired absence of both cervix and uterus: Secondary | ICD-10-CM

## 2022-02-11 DIAGNOSIS — Z880 Allergy status to penicillin: Secondary | ICD-10-CM | POA: Diagnosis not present

## 2022-02-11 DIAGNOSIS — Z885 Allergy status to narcotic agent status: Secondary | ICD-10-CM | POA: Diagnosis not present

## 2022-02-11 DIAGNOSIS — I10 Essential (primary) hypertension: Secondary | ICD-10-CM | POA: Diagnosis present

## 2022-02-11 DIAGNOSIS — J449 Chronic obstructive pulmonary disease, unspecified: Secondary | ICD-10-CM | POA: Diagnosis not present

## 2022-02-11 DIAGNOSIS — Z87891 Personal history of nicotine dependence: Secondary | ICD-10-CM

## 2022-02-11 DIAGNOSIS — Z882 Allergy status to sulfonamides status: Secondary | ICD-10-CM | POA: Diagnosis not present

## 2022-02-11 DIAGNOSIS — D649 Anemia, unspecified: Secondary | ICD-10-CM | POA: Diagnosis present

## 2022-02-11 DIAGNOSIS — J90 Pleural effusion, not elsewhere classified: Secondary | ICD-10-CM | POA: Diagnosis not present

## 2022-02-11 DIAGNOSIS — Z20822 Contact with and (suspected) exposure to covid-19: Secondary | ICD-10-CM | POA: Diagnosis present

## 2022-02-11 DIAGNOSIS — I495 Sick sinus syndrome: Principal | ICD-10-CM | POA: Diagnosis present

## 2022-02-11 DIAGNOSIS — Z888 Allergy status to other drugs, medicaments and biological substances status: Secondary | ICD-10-CM | POA: Diagnosis not present

## 2022-02-11 DIAGNOSIS — R55 Syncope and collapse: Secondary | ICD-10-CM | POA: Diagnosis not present

## 2022-02-11 DIAGNOSIS — I2781 Cor pulmonale (chronic): Secondary | ICD-10-CM | POA: Diagnosis present

## 2022-02-11 DIAGNOSIS — N1831 Chronic kidney disease, stage 3a: Secondary | ICD-10-CM | POA: Diagnosis not present

## 2022-02-11 DIAGNOSIS — Z8701 Personal history of pneumonia (recurrent): Secondary | ICD-10-CM | POA: Diagnosis not present

## 2022-02-11 DIAGNOSIS — J309 Allergic rhinitis, unspecified: Secondary | ICD-10-CM | POA: Diagnosis not present

## 2022-02-11 DIAGNOSIS — K219 Gastro-esophageal reflux disease without esophagitis: Secondary | ICD-10-CM | POA: Diagnosis not present

## 2022-02-11 DIAGNOSIS — R001 Bradycardia, unspecified: Principal | ICD-10-CM | POA: Diagnosis present

## 2022-02-11 DIAGNOSIS — Z881 Allergy status to other antibiotic agents status: Secondary | ICD-10-CM | POA: Diagnosis not present

## 2022-02-11 DIAGNOSIS — Z79899 Other long term (current) drug therapy: Secondary | ICD-10-CM | POA: Diagnosis not present

## 2022-02-11 DIAGNOSIS — J9811 Atelectasis: Secondary | ICD-10-CM | POA: Diagnosis not present

## 2022-02-11 DIAGNOSIS — E538 Deficiency of other specified B group vitamins: Secondary | ICD-10-CM | POA: Diagnosis present

## 2022-02-11 DIAGNOSIS — I129 Hypertensive chronic kidney disease with stage 1 through stage 4 chronic kidney disease, or unspecified chronic kidney disease: Secondary | ICD-10-CM | POA: Diagnosis not present

## 2022-02-11 DIAGNOSIS — Z85828 Personal history of other malignant neoplasm of skin: Secondary | ICD-10-CM

## 2022-02-11 LAB — BASIC METABOLIC PANEL
Anion gap: 6 (ref 5–15)
BUN: 17 mg/dL (ref 8–23)
CO2: 26 mmol/L (ref 22–32)
Calcium: 9.3 mg/dL (ref 8.9–10.3)
Chloride: 109 mmol/L (ref 98–111)
Creatinine, Ser: 1.06 mg/dL — ABNORMAL HIGH (ref 0.44–1.00)
GFR, Estimated: 54 mL/min — ABNORMAL LOW (ref >60)
Glucose, Bld: 106 mg/dL — ABNORMAL HIGH (ref 70–99)
Potassium: 4.1 mmol/L (ref 3.5–5.1)
Sodium: 141 mmol/L (ref 135–145)

## 2022-02-11 LAB — ECHOCARDIOGRAM COMPLETE
AR max vel: 1.69 cm2
AV Area VTI: 1.56 cm2
AV Area mean vel: 1.58 cm2
AV Mean grad: 8 mmHg
AV Peak grad: 16.2 mmHg
Ao pk vel: 2.01 m/s
Area-P 1/2: 3.3 cm2
Height: 64 in
S' Lateral: 3.38 cm
Weight: 3476.8 oz

## 2022-02-11 LAB — CBC
HCT: 32.9 % — ABNORMAL LOW (ref 36.0–46.0)
Hemoglobin: 10.5 g/dL — ABNORMAL LOW (ref 12.0–15.0)
MCH: 28.7 pg (ref 26.0–34.0)
MCHC: 31.9 g/dL (ref 30.0–36.0)
MCV: 89.9 fL (ref 80.0–100.0)
Platelets: 159 10*3/uL (ref 150–400)
RBC: 3.66 MIL/uL — ABNORMAL LOW (ref 3.87–5.11)
RDW: 13 % (ref 11.5–15.5)
WBC: 6.7 10*3/uL (ref 4.0–10.5)
nRBC: 0 % (ref 0.0–0.2)

## 2022-02-11 LAB — GLUCOSE, CAPILLARY
Glucose-Capillary: 122 mg/dL — ABNORMAL HIGH (ref 70–99)
Glucose-Capillary: 118 mg/dL — ABNORMAL HIGH (ref 70–99)
Glucose-Capillary: 104 mg/dL — ABNORMAL HIGH (ref 70–99)

## 2022-02-11 MED ORDER — ATROPINE SULFATE 1 MG/10ML IJ SOSY
PREFILLED_SYRINGE | INTRAMUSCULAR | Status: AC
  Filled 2022-02-11: qty 10

## 2022-02-11 MED ORDER — GERHARDT'S BUTT CREAM
TOPICAL_CREAM | Freq: Four times a day (QID) | CUTANEOUS | Status: DC | PRN
  Filled 2022-02-11: qty 1

## 2022-02-11 MED ORDER — ACETAMINOPHEN 650 MG RE SUPP: 650.0000 mg | Freq: Four times a day (QID) | RECTAL | Status: DC | PRN

## 2022-02-11 MED ORDER — VITAMIN B-12 1000 MCG PO TABS
1000.0000 ug | ORAL_TABLET | Freq: Every day | ORAL | Status: DC
  Administered 2022-02-12 – 2022-02-14 (×3): 1000 ug via ORAL
  Filled 2022-02-11 (×3): qty 1

## 2022-02-11 MED ORDER — ACETAMINOPHEN 325 MG PO TABS
650.0000 mg | ORAL_TABLET | Freq: Four times a day (QID) | ORAL | Status: DC | PRN
  Administered 2022-02-13 – 2022-02-14 (×2): 650 mg via ORAL
  Filled 2022-02-11 (×2): qty 2

## 2022-02-11 MED ORDER — PANTOPRAZOLE SODIUM 40 MG PO TBEC
40.0000 mg | DELAYED_RELEASE_TABLET | Freq: Every day | ORAL | Status: DC
  Administered 2022-02-12 – 2022-02-14 (×3): 40 mg via ORAL
  Filled 2022-02-11 (×3): qty 1

## 2022-02-11 MED ORDER — LORATADINE 10 MG PO TABS
10.0000 mg | ORAL_TABLET | Freq: Every day | ORAL | Status: DC
  Administered 2022-02-12 – 2022-02-14 (×3): 10 mg via ORAL
  Filled 2022-02-11 (×3): qty 1

## 2022-02-11 MED ORDER — CHLORHEXIDINE GLUCONATE CLOTH 2 % EX PADS
6.0000 | MEDICATED_PAD | Freq: Every day | CUTANEOUS | Status: DC
  Administered 2022-02-11: 6 via TOPICAL

## 2022-02-11 MED ORDER — AMLODIPINE BESYLATE 5 MG PO TABS
5.0000 mg | ORAL_TABLET | Freq: Every day | ORAL | Status: DC
  Administered 2022-02-12 – 2022-02-13 (×2): 5 mg via ORAL
  Filled 2022-02-11 (×2): qty 1

## 2022-02-11 MED ORDER — ALBUTEROL SULFATE (2.5 MG/3ML) 0.083% IN NEBU
2.5000 mg | INHALATION_SOLUTION | RESPIRATORY_TRACT | Status: DC | PRN
  Administered 2022-02-12: 2.5 mg via RESPIRATORY_TRACT
  Filled 2022-02-11: qty 3

## 2022-02-11 MED ORDER — PRAVASTATIN SODIUM 40 MG PO TABS
40.0000 mg | ORAL_TABLET | Freq: Every day | ORAL | Status: DC
  Administered 2022-02-12 – 2022-02-14 (×3): 40 mg via ORAL
  Filled 2022-02-11 (×3): qty 1

## 2022-02-11 NOTE — Progress Notes (Signed)
Pacing pads placed on pt as ordered

## 2022-02-11 NOTE — Progress Notes (Signed)
[facility] picked up patient and is now in transit to [facility]. Sela Hua, RN to make her aware of pickup.  Cameron Ali, RN
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
## 2022-02-11 NOTE — Progress Notes (Signed)
[facility] called stating that this patient has a bed at [facility] ([address]). Report called and spoke to [facility], RN, about the patient. Awaiting call back from [facility] to state ETA at [facility].  Cameron Ali, RN
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
## 2022-02-11 NOTE — Care Management Obs Status (Signed)
[place] NOTIFICATION   Patient Details  Name: Renee Meyer MRN: [id number] Date of Birth: 08-31-1940   Medicare Observation Status Notification Given:  Yes    Beverly Sessions, RN 02/11/2022, 3:03 PM

## 2022-02-11 NOTE — Progress Notes (Signed)
The patient is transferred from 2 A to ICU 15. A & O x 4. Denied any acute pain. NSR at this moment. Will continue to monitor

## 2022-02-11 NOTE — Progress Notes (Signed)
*  PRELIMINARY RESULTS* Echocardiogram 2D Echocardiogram has been performed.  Renee Meyer 02/11/2022, 10:39 AM
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
## 2022-02-11 NOTE — Progress Notes (Addendum)
The patient said it burns when she urinates. She added the symptoms resemble her previous UTI s symptoms. Notified [place] NP and received an order for U/A.

## 2022-02-11 NOTE — Progress Notes (Signed)
Progress Note  Patient Name: Renee Meyer Date of Encounter: 02/11/2022  [facility] Cardiologist: Nelva Bush, MD   Subjective   Patient seen on AM rounds. Overnight had to be transferred to ICU for closer observation after she experienced a few episodes of her vision going white and passing out with pauses of 3.24 and 3.87 seconds were the longest events documented and a heart rate in the 20-30's. She did receive a dose of atropine. She denies any chest pain or shortness of breath.   Inpatient Medications    Scheduled Meds:  amLODipine  5 mg Oral QHS   Chlorhexidine Gluconate Cloth  6 each Topical Daily   cholecalciferol  2,000 Units Oral Daily   enoxaparin (LOVENOX) injection  0.5 mg/kg Subcutaneous QHS   [START ON 02/13/2022] metolazone  2.5 mg Oral Once per day on Mon Fri   pantoprazole  40 mg Oral Daily   pravastatin  40 mg Oral q1800   sodium chloride flush  3 mL Intravenous Q12H   vitamin B-12  1,000 mcg Oral Daily   Continuous Infusions:  PRN Meds: acetaminophen **OR** acetaminophen, albuterol, Gerhardt's butt cream, hydrALAZINE, meclizine, ondansetron **OR** ondansetron (ZOFRAN) IV   Vital Signs    Vitals:   02/11/22 0400 02/11/22 0435 02/11/22 0500 02/11/22 0600  BP: (!) 151/70  (!) 147/67 (!) 150/68  Pulse: 75 69 72 67  Resp: '14 16 17 16  '$ Temp:   97.7 F (36.5 C)   TempSrc:   Oral   SpO2: 95% 95% 95% 95%  Weight:      Height:        Intake/Output Summary (Last 24 hours) at 02/11/2022 0757 Last data filed at 02/10/2022 2000 Gross per 24 hour  Intake --  Output 650 ml  Net -650 ml      02/10/2022    8:51 PM 02/10/2022    5:58 PM 03/04/2021   10:25 AM  Last 3 Weights  Weight (lbs) 217 lb 4.8 oz 216 lb 219 lb  Weight (kg) 98.567 kg 97.977 kg 99.338 kg      Telemetry    Sinus rate of 60-80, several episodes noted of SB with pauses rates as low as 20's - Personally Reviewed  ECG     No new tracings- Personally Reviewed  Physical Exam    GEN: No acute distress. Awake, alert Neck: No JVD appreciated Cardiac: RRR, II/VI systolic murmur, without rubs, or gallops.  Respiratory: Clear to auscultation bilaterally. Respirations are unlabored on room air. GI: Soft, nontender, non-distended, bowel sounds present in all 4 quadrants MS: Trace edema  to BLE; No deformity. Neuro:  Nonfocal  Psych: Normal affect   Labs    High Sensitivity Troponin:   Recent Labs  Lab 02/10/22 1455  TROPONINIHS 3     Chemistry Recent Labs  Lab 02/10/22 1445 02/10/22 1455 02/11/22 0502  NA 140  --  141  K 4.6  --  4.1  CL 107  --  109  CO2 24  --  26  GLUCOSE 93  --  106*  BUN 20  --  17  CREATININE 1.08*  --  1.06*  CALCIUM 9.1  --  9.3  MG  --  2.4  --   GFRNONAA 53*  --  54*  ANIONGAP 9  --  6    Lipids No results for input(s): "CHOL", "TRIG", "HDL", "LABVLDL", "LDLCALC", "CHOLHDL" in the last 168 hours.  Hematology Recent Labs  Lab 02/10/22 1445 02/11/22 0502  WBC 9.2 6.7  RBC 3.92 3.66*  HGB 11.0* 10.5*  HCT 35.7* 32.9*  MCV 91.1 89.9  MCH 28.1 28.7  MCHC 30.8 31.9  RDW 13.2 13.0  PLT PLATELET CLUMPS NOTED ON SMEAR, UNABLE TO ESTIMATE 159   Thyroid  Recent Labs  Lab 02/10/22 1455 02/10/22 1512  TSH 1.605  --   FREET4  --  1.06    BNP Recent Labs  Lab 02/10/22 1514  BNP 45.4    DDimer No results for input(s): "DDIMER" in the last 168 hours.   Radiology    CT HEAD WO CONTRAST (5MM)  Result Date: 02/10/2022 CLINICAL DATA:  Syncope dizziness EXAM: CT HEAD WITHOUT CONTRAST TECHNIQUE: Contiguous axial images were obtained from the base of the skull through the vertex without intravenous contrast. RADIATION DOSE REDUCTION: This exam was performed according to the departmental dose-optimization program which includes automated exposure control, adjustment of the mA and/or kV according to patient size and/or use of iterative reconstruction technique. COMPARISON:  CT brain 12/01/2021, MRI 12/08/2021 FINDINGS:  Brain: No acute territorial infarction, hemorrhage, or intracranial mass. The ventricles are nonenlarged. Vascular: No hyperdense vessels.  Carotid vascular calcification. Skull: No fracture Sinuses/Orbits: Completely opacified right maxillary sinus, right frontal sinus and anterior ethmoid sinuses. Differential fluid level in the right maxillary sinus incompletely visualized. This is chronic. Other: None IMPRESSION: No CT evidence for acute intracranial abnormality. Chronic right sinus disease Electronically Signed   By: Donavan Foil M.D.   On: 02/10/2022 17:23   DG Chest 2 View  Result Date: 02/10/2022 CLINICAL DATA:  Provided history: Syncope, heart block.  Dizziness. EXAM: CHEST - 2 VIEW COMPARISON:  Chest radiographs 07/26/2018. Report from chest radiograph 03/08/2020 (images unavailable). Chest CT 07/26/2018. FINDINGS: Heart size within normal limits. Aortic atherosclerosis. Mild atelectasis within the bilateral lung bases. No appreciable airspace consolidation or pulmonary edema. No evidence of pleural effusion or pneumothorax. Mild elevation of the right hemidiaphragm. No acute bony abnormality identified. IMPRESSION: No appreciable airspace consolidation or pulmonary edema. Mild atelectasis within the bilateral lung bases. Mild elevation of the right hemidiaphragm. Aortic Atherosclerosis (ICD10-I70.0). Electronically Signed   By: Kellie Simmering D.O.   On: 02/10/2022 15:50    Cardiac Studies   Echocardiogram pending  Patient Profile     81 y.o. female with a history of COPD, GERD, hyperlipidemia, essential hypertension, heart murmur, cancer, dyspnea, and anemia who is being seen and evaluated for syncope.   Assessment & Plan    Syncope       Dizziness       Symptomatic bradycardia with sinus pause on EKG -metoprolol remains on hold -avoid AVN blocking agents -pacing pads remain on the patient -EP, Dr. Quentin Ore to evaluate for PPM today -continue telemetry monitoring -atropine at the  bedside -obtain orthostatic vitals -continue meclizine -TSH 1.605, free T4 1.06 -echocardiogram ordered and pending -Respiratory panel negative -potassium 4.1 and mg 2.4 -reminded of no driving until cause of syncope is resolved or 6 months without an incident   2. Essential hypertension -hold metoprolol -continue amlodipine 5 mg daily -continue hydralazine PRN -vitals per unit protocol  3.Hyperlipidemia -continue PTA statin -LDL 92 -consider adding zetia 10 mg prior to discharge  4. Heart murmur -echocardiogram ordered and pending   For questions or updates, please contact [place] Please consult [email or web address] for contact info under        Signed, Chidi Shirer, NP  02/11/2022, 7:57 AM

## 2022-02-11 NOTE — Progress Notes (Signed)
Received pt from [facility] via carelink to 2C08.  Pt alert, oriented x4.  VSS, NAD.  Pt oriented to room and plan of care.  Admitting paged.

## 2022-02-11 NOTE — Plan of Care (Signed)

## 2022-02-11 NOTE — Progress Notes (Addendum)
Her HR dropped to 25 nonsustained. On reassessment, she stated, "I  didn't feel it this time." Notified Foust NP without any new order. Will continue to monitor.

## 2022-02-12 DIAGNOSIS — J309 Allergic rhinitis, unspecified: Secondary | ICD-10-CM | POA: Diagnosis present

## 2022-02-12 DIAGNOSIS — R001 Bradycardia, unspecified: Secondary | ICD-10-CM | POA: Diagnosis not present

## 2022-02-12 DIAGNOSIS — D649 Anemia, unspecified: Secondary | ICD-10-CM | POA: Diagnosis present

## 2022-02-12 LAB — CBC WITH DIFFERENTIAL/PLATELET
Abs Immature Granulocytes: 0.01 10*3/uL (ref 0.00–0.07)
Basophils Absolute: 0.1 10*3/uL (ref 0.0–0.1)
Basophils Relative: 1 %
Eosinophils Absolute: 0.2 10*3/uL (ref 0.0–0.5)
Eosinophils Relative: 4 %
HCT: 33.8 % — ABNORMAL LOW (ref 36.0–46.0)
Hemoglobin: 10.7 g/dL — ABNORMAL LOW (ref 12.0–15.0)
Immature Granulocytes: 0 %
Lymphocytes Relative: 24 %
Lymphs Abs: 1.5 10*3/uL (ref 0.7–4.0)
MCH: 29.1 pg (ref 26.0–34.0)
MCHC: 31.7 g/dL (ref 30.0–36.0)
MCV: 91.8 fL (ref 80.0–100.0)
Monocytes Absolute: 0.6 10*3/uL (ref 0.1–1.0)
Monocytes Relative: 9 %
Neutro Abs: 4.1 10*3/uL (ref 1.7–7.7)
Neutrophils Relative %: 62 %
Platelets: 154 10*3/uL (ref 150–400)
RBC: 3.68 MIL/uL — ABNORMAL LOW (ref 3.87–5.11)
RDW: 13.3 % (ref 11.5–15.5)
WBC: 6.4 10*3/uL (ref 4.0–10.5)
nRBC: 0 % (ref 0.0–0.2)

## 2022-02-12 LAB — COMPREHENSIVE METABOLIC PANEL
ALT: 11 U/L (ref 0–44)
AST: 12 U/L — ABNORMAL LOW (ref 15–41)
Albumin: 3.1 g/dL — ABNORMAL LOW (ref 3.5–5.0)
Alkaline Phosphatase: 73 U/L (ref 38–126)
Anion gap: 8 (ref 5–15)
BUN: 17 mg/dL (ref 8–23)
CO2: 21 mmol/L — ABNORMAL LOW (ref 22–32)
Calcium: 9.2 mg/dL (ref 8.9–10.3)
Chloride: 111 mmol/L (ref 98–111)
Creatinine, Ser: 1.13 mg/dL — ABNORMAL HIGH (ref 0.44–1.00)
GFR, Estimated: 50 mL/min — ABNORMAL LOW (ref >60)
Glucose, Bld: 104 mg/dL — ABNORMAL HIGH (ref 70–99)
Potassium: 4.1 mmol/L (ref 3.5–5.1)
Sodium: 140 mmol/L (ref 135–145)
Total Bilirubin: 0.8 mg/dL (ref 0.3–1.2)
Total Protein: 5.9 g/dL — ABNORMAL LOW (ref 6.5–8.1)

## 2022-02-12 LAB — SURGICAL PCR SCREEN
MRSA, PCR: NEGATIVE
Staphylococcus aureus: NEGATIVE

## 2022-02-12 LAB — MAGNESIUM: Magnesium: 2.1 mg/dL (ref 1.7–2.4)

## 2022-02-12 MED ORDER — CHLORHEXIDINE GLUCONATE 4 % EX LIQD
60.0000 mL | Freq: Once | CUTANEOUS | Status: AC
  Administered 2022-02-12: 4 via TOPICAL
  Filled 2022-02-12: qty 60

## 2022-02-12 MED ORDER — CHLORHEXIDINE GLUCONATE 4 % EX LIQD
60.0000 mL | Freq: Once | CUTANEOUS | Status: AC
  Administered 2022-02-13: 4 via TOPICAL
  Filled 2022-02-12: qty 60

## 2022-02-12 MED ORDER — SODIUM CHLORIDE 0.9 % IV SOLN
80.0000 mg | INTRAVENOUS | Status: AC
  Administered 2022-02-13: 80 mg

## 2022-02-12 MED ORDER — SODIUM CHLORIDE 0.9 % IV SOLN: INTRAVENOUS | Status: DC

## 2022-02-12 MED ORDER — VANCOMYCIN HCL IN DEXTROSE 1-5 GM/200ML-% IV SOLN
1000.0000 mg | INTRAVENOUS | Status: AC
  Administered 2022-02-13: 1000 mg via INTRAVENOUS
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
## 2022-02-12 NOTE — Assessment & Plan Note (Signed)
 #)   Syncope: At least 2 episodes of syncope over the last 72 hours, with episode occurring while at [facility] appearing to correlate with bradycardia and prolonged sinus pause, suggestive of symptomatic bradycardia, resulting in EP cardiology recommendation for consideration of pacemaker placement for symptomatic bradycardia, as above.  No evidence of associated acute focal neurologic deficits to suggest acute CVA, nor any evidence of seizures.  Plan: Further evaluation management of presenting symptomatic bradycardia, as above, including consideration for pacemaker placement.  Monitor on symmetry.  Continue to hold outpatient beta-blocker.  Repeat CMP and CBC in the morning.  Fall precautions ordered.

## 2022-02-12 NOTE — Assessment & Plan Note (Signed)
 #)   Symptomatic bradycardia: In the setting of intermittent heart rates into the 30s to 50s with sinus pauses of greater than 3 seconds correlating with episodes of dizziness and additional episode of syncope while at [facility], [place] cardiology was consulted at [facility], and conveyed concern regarding symptomatic bradycardia with ensuing recommendation for transfer to [facility] for further evaluation for potential pacemaker placement.  Since presenting to [facility], patient with heart rates in the 60s to 70s, and normotensive blood pressures.  Asymptomatic at this time.  Echocardiogram performed earlier today, with results as above.  Plan: Monitor on symmetry.  External pacer pads to remain attached.  Add Unser magnesium level.  Check CMP and CBC.  I have also requested cardiology consultation for the morning.  Continue to hold outpatient metoprolol succinate.

## 2022-02-12 NOTE — Assessment & Plan Note (Signed)
  #)   Hyperlipidemia: documented h/o such. On pravastatin as outpatient.    Plan: continue home statin.

## 2022-02-12 NOTE — Assessment & Plan Note (Signed)
 #)   Allergic rhinitis: Documented history of such, on scheduled loratadine as an outpatient, in the absence of any scheduled intranasal corticosteroids.  Plan: Continue outpatient loratadine.  Consideration for initiation of intranasal Flonase as present intervention for her allergic rhinitis.

## 2022-02-12 NOTE — Plan of Care (Signed)
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
## 2022-02-12 NOTE — Assessment & Plan Note (Signed)
  #)   Stage IIIa CKD: Documented history of such, associated with baseline creatinine range 1.01.2, presenting labs demonstrating serum creatinine level consistent with his baseline range.  Plan: Monitor strict I's and O's and daily weights.  Tempt avoid nephrotoxic agents.  Repeat CMP in the morning.

## 2022-02-12 NOTE — Discharge Instructions (Addendum)
After Your Pacemaker  ACTIVITY Do not lift your arm above shoulder height for 1 week after your procedure. After 7 days, you may progress as below.  You should remove your sling 24 hours after your procedure, unless otherwise instructed by your provider.     Friday February 20, 2022  Saturday February 21, 2022 Sunday February 22, 2022 Monday February 23, 2022   Do not lift, push, pull, or carry anything over 10 pounds with the affected arm until 6 weeks (Friday March 27, 2022 ) after your procedure.   You may drive AFTER your wound check, unless you have been told otherwise by your provider.   Ask your healthcare provider when you can go back to work   INCISION/Dressing If you are on a blood thinner such as Coumadin, Xarelto, Eliquis, Plavix, or Pradaxa please confirm with your provider when this should be resumed.   If large square, outer bandage is left in place, this can be removed after 24 hours from your procedure. Do not remove steri-strips or glue as below.   Monitor your Pacemaker site for redness, swelling, and drainage. Call the device clinic at [phone] if you experience these symptoms or fever/chills.  If your incision is sealed with Steri-strips or staples, you may shower 7 days after your procedure or when told by your provider. Do not remove the steri-strips or let the shower hit directly on your site. You may wash around your site with soap and water.    If you were discharged in a sling, please do not wear this during the day more than 48 hours after your surgery unless otherwise instructed. This may increase the risk of stiffness and soreness in your shoulder.   Avoid lotions, ointments, or perfumes over your incision until it is well-healed.  You may use a hot tub or a pool AFTER your wound check appointment if the incision is completely closed.  Pacemaker Alerts:  Some alerts are vibratory and others beep. These are NOT emergencies. Please call our office to let us know. If this  occurs at night or on weekends, it can wait until the next business day. Send a remote transmission.  If your device is capable of reading fluid status (for heart failure), you will be offered monthly monitoring to review this with you.   DEVICE MANAGEMENT Remote monitoring is used to monitor your pacemaker from home. This monitoring is scheduled every 91 days by our office. It allows [place] to keep an eye on the functioning of your device to ensure it is working properly. You will routinely see your Electrophysiologist annually (more often if necessary).   You should receive your ID card for your new device in 4-8 weeks. Keep this card with you at all times once received. Consider wearing a medical alert bracelet or necklace.  Your Pacemaker may be MRI compatible. This will be discussed at your next office visit/wound check.  You should avoid contact with strong electric or magnetic fields.   Do not use amateur (ham) radio equipment or electric (arc) welding torches. MP3 player headphones with magnets should not be used. Some devices are safe to use if held at least 12 inches (30 cm) from your Pacemaker. These include power tools, lawn mowers, and speakers. If you are unsure if something is safe to use, ask your health care provider.  When using your cell phone, hold it to the ear that is on the opposite side from the Pacemaker. Do not leave your cell phone  in a pocket over the Pacemaker.  You may safely use electric blankets, heating pads, computers, and microwave ovens.  Call the office right away if: You have chest pain. You feel more short of breath than you have felt before. You feel more light-headed than you have felt before. Your incision starts to open up.  This information is not intended to replace advice given to you by your health care provider. Make sure you discuss any questions you have with your health care provider.

## 2022-02-12 NOTE — TOC Initial Note (Signed)
Transition of Care [facility]) - Initial/Assessment Note    Patient Details  Name: Renee Meyer MRN: [id number] Date of Birth: [birth year]  [facility]) CM/SW Contact:    Verdell Carmine, RN Phone Number: 02/12/2022, 2:57 PM  Clinical Narrative:                  TOC has reviewed the chart and identified no needs at this time. We will continue to review and monitor patient with progression rounds. Please place a consult if needs identified.        Patient Goals and CMS Choice        Expected Discharge Plan and Services                                                Prior Living Arrangements/Services                       Activities of Daily Living      Permission Sought/Granted                  Emotional Assessment              Admission diagnosis:  Bradycardia [R00.1] Patient Active Problem List   Diagnosis Date Noted   Allergic rhinitis 02/12/2022   Chronic anemia 02/12/2022   Bradycardia    Syncope 02/10/2022   Essential hypertension 02/10/2022   Stage 3a chronic kidney disease (CKD) ([place]) 02/10/2022   Sinus node dysfunction ([place])    Lower limb ulcer, ankle, right, limited to breakdown of skin ([place]) 01/03/2021   Swelling of limb 01/03/2021   Chronic cor pulmonale ([place]) 01/04/2019   Anemia due to chronic blood loss 12/23/2017   Adult idiopathic generalized osteoporosis 12/15/2016   B12 deficiency 12/15/2016   Tubular adenoma 12/15/2016   Medicare annual wellness visit, initial 12/15/2016   Fracture of ankle 07/29/2016   COPD, moderate ([place]) 12/05/2015   Asymptomatic PVCs 11/26/2014   Mixed hyperlipidemia 05/22/2014   GERD (gastroesophageal reflux disease) 05/22/2014   Abnormal mammogram 08/01/2013   PCP:  Rusty Aus, MD Pharmacy:   [facility] [address]), [place]) [place] [address] Phone: [phone] Fax:  [phone]     Social Determinants of Health (SDOH) Interventions    Readmission Risk Interventions     No data to display

## 2022-02-12 NOTE — Assessment & Plan Note (Signed)
 #)   Essential hypertension: Outpatient hypertensive regimen includes Toprol succinate as well as amlodipine.  In setting of presenting symptomatic bradycardia will, will hold home beta-blocker, as above.  Additionally, given multiple episodes of syncope, will also hold home amlodipine for now.  Plan: Hold home beta-blocker and amlodipine for now, as above.)  Resume blood pressure routine vital signs.

## 2022-02-12 NOTE — Progress Notes (Signed)
Electrophysiology Rounding Note  Patient Name: Renee Meyer Date of Encounter: 02/12/2022  Primary Cardiologist: Nelva Bush, MD Electrophysiologist: None   Subjective   The patient is doing well today.  At this time, the patient denies chest pain, shortness of breath, or any new concerns.  Inpatient Medications    Scheduled Meds:  amLODipine  5 mg Oral QHS   loratadine  10 mg Oral Daily   pantoprazole  40 mg Oral Daily   pravastatin  40 mg Oral Daily   vitamin B-12  1,000 mcg Oral Daily   Continuous Infusions:  PRN Meds: acetaminophen **OR** acetaminophen, albuterol   Vital Signs    Vitals:   02/12/22 0000 02/12/22 0300 02/12/22 0632 02/12/22 0754  BP: (!) 150/73 (!) 160/62  140/62  Pulse: 75 77  77  Resp: '16 19  17  '$ Temp:  98.6 F (37 C)  98.8 F (37.1 C)  TempSrc:  Oral  Oral  SpO2: 92% 92%  97%  Weight:   96.3 kg     Intake/Output Summary (Last 24 hours) at 02/12/2022 0803 Last data filed at 02/12/2022 0425 Gross per 24 hour  Intake --  Output 0 ml  Net 0 ml   Filed Weights   02/12/22 6270  Weight: 96.3 kg    Physical Exam    GEN- The patient is well appearing, alert and oriented x 3 today.   Head- normocephalic, atraumatic Eyes-  Sclera clear, conjunctiva pink Ears- hearing intact Oropharynx- clear Neck- supple Lungs- Clear to ausculation bilaterally, normal work of breathing Heart- Regular rate and rhythm, no murmurs, rubs or gallops GI- soft, NT, ND, + BS Extremities- no clubbing or cyanosis. No edema Skin- no rash or lesion Psych- euthymic mood, full affect Neuro- strength and sensation are intact  Labs    CBC Recent Labs    02/11/22 0502 02/12/22 0141  WBC 6.7 6.4  NEUTROABS  --  4.1  HGB 10.5* 10.7*  HCT 32.9* 33.8*  MCV 89.9 91.8  PLT 159 350   Basic Metabolic Panel Recent Labs    02/10/22 1455 02/11/22 0502 02/12/22 0141  NA  --  141 140  K  --  4.1 4.1  CL  --  109 111  CO2  --  26 21*  GLUCOSE  --  106*  104*  BUN  --  17 17  CREATININE  --  1.06* 1.13*  CALCIUM  --  9.3 9.2  MG 2.4  --  2.1   Liver Function Tests Recent Labs    02/12/22 0141  AST 12*  ALT 11  ALKPHOS 73  BILITOT 0.8  PROT 5.9*  ALBUMIN 3.1*   No results for input(s): "LIPASE", "AMYLASE" in the last 72 hours. Cardiac Enzymes No results for input(s): "CKTOTAL", "CKMB", "CKMBINDEX", "TROPONINI" in the last 72 hours.   Telemetry    NSR 70s mostly, was having brief 1-3 second sinus pauses initially on arrival; (personally reviewed)  Radiology    ECHOCARDIOGRAM COMPLETE  Result Date: 02/11/2022    ECHOCARDIOGRAM REPORT   Patient Name:   Renee Meyer Date of Exam: 02/11/2022 Medical Rec #:  [id number]      Height:       64.0 in Accession #:    [id number]     Weight:       217.3 lb Date of Birth:  11-20-1940      BSA:          2.027 m Patient Age:  80 years       BP:           141/56 mmHg Patient Gender: F              HR:           55 bpm. Exam Location:  [facility] Procedure: 2D Echo, Color Doppler and Cardiac Doppler Indications:     R55 Syncope  History:         Patient has no prior history of Echocardiogram examinations.                  COPD; Risk Factors:Dyslipidemia.  Sonographer:     Charmayne Sheer Referring Phys:  [id number] AMY N [facility] Diagnosing Phys: Kate Sable MD  Sonographer Comments: Suboptimal parasternal window. Image acquisition challenging due to COPD. IMPRESSIONS  1. Left ventricular ejection fraction, by estimation, is 55 to 60%. The left ventricle has normal function. The left ventricle has no regional wall motion abnormalities. Left ventricular diastolic parameters were normal.  2. Right ventricular systolic function is normal. The right ventricular size is normal.  3. The mitral valve is normal in structure. Mild mitral valve regurgitation.  4. The aortic valve was not well visualized. Aortic valve regurgitation is not visualized.  5. The inferior vena cava is dilated in size with >50% respiratory  variability, suggesting right atrial pressure of 8 mmHg. FINDINGS  Left Ventricle: Left ventricular ejection fraction, by estimation, is 55 to 60%. The left ventricle has normal function. The left ventricle has no regional wall motion abnormalities. The left ventricular internal cavity size was normal in size. There is  no left ventricular hypertrophy. Left ventricular diastolic parameters were normal. Right Ventricle: The right ventricular size is normal. No increase in right ventricular wall thickness. Right ventricular systolic function is normal. Left Atrium: Left atrial size was normal in size. Right Atrium: Right atrial size was normal in size. Pericardium: There is no evidence of pericardial effusion. Mitral Valve: The mitral valve is normal in structure. Mild mitral valve regurgitation. Tricuspid Valve: The tricuspid valve is grossly normal. Tricuspid valve regurgitation is not demonstrated. Aortic Valve: The aortic valve was not well visualized. Aortic valve regurgitation is not visualized. Aortic valve mean gradient measures 8.0 mmHg. Aortic valve peak gradient measures 16.2 mmHg. Aortic valve area, by VTI measures 1.56 cm. Pulmonic Valve: The pulmonic valve was not well visualized. Pulmonic valve regurgitation is not visualized. Aorta: The aortic root is normal in size and structure. Venous: The inferior vena cava is dilated in size with greater than 50% respiratory variability, suggesting right atrial pressure of 8 mmHg. IAS/Shunts: No atrial level shunt detected by color flow Doppler.  LEFT VENTRICLE PLAX 2D LVIDd:         4.31 cm   Diastology LVIDs:         3.38 cm   LV e' medial:    7.83 cm/s LV PW:         0.95 cm   LV E/e' medial:  11.3 LV IVS:        1.00 cm   LV e' lateral:   9.90 cm/s LVOT diam:     1.90 cm   LV E/e' lateral: 9.0 LV SV:         73 LV SV Index:   36 LVOT Area:     2.84 cm  RIGHT VENTRICLE RV Basal diam:  3.90 cm RV S prime:     15.30 cm/s TAPSE (M-mode): 2.3 cm LEFT ATRIUM  Index        RIGHT ATRIUM           Index LA diam:        3.20 cm 1.58 cm/m   RA Area:     13.70 cm LA Vol (A2C):   36.1 ml 17.81 ml/m  RA Volume:   35.50 ml  17.52 ml/m LA Vol (A4C):   36.2 ml 17.86 ml/m LA Biplane Vol: 37.0 ml 18.26 ml/m  AORTIC VALVE AV Area (Vmax):    1.69 cm AV Area (Vmean):   1.58 cm AV Area (VTI):     1.56 cm AV Vmax:           201.00 cm/s AV Vmean:          133.000 cm/s AV VTI:            0.464 m AV Peak Grad:      16.2 mmHg AV Mean Grad:      8.0 mmHg LVOT Vmax:         120.00 cm/s LVOT Vmean:        73.900 cm/s LVOT VTI:          0.256 m LVOT/AV VTI ratio: 0.55  AORTA Ao Root diam: 2.90 cm MITRAL VALVE MV Area (PHT): 3.30 cm    SHUNTS MV Decel Time: 230 msec    Systemic VTI:  0.26 m MV E velocity: 88.70 cm/s  Systemic Diam: 1.90 cm MV A velocity: 87.80 cm/s MV E/A ratio:  1.01 Kate Sable MD Electronically signed by Kate Sable MD Signature Date/Time: 02/11/2022/12:37:34 PM    Final    CT HEAD WO CONTRAST (5MM)  Result Date: 02/10/2022 CLINICAL DATA:  Syncope dizziness EXAM: CT HEAD WITHOUT CONTRAST TECHNIQUE: Contiguous axial images were obtained from the base of the skull through the vertex without intravenous contrast. RADIATION DOSE REDUCTION: This exam was performed according to the departmental dose-optimization program which includes automated exposure control, adjustment of the mA and/or kV according to patient size and/or use of iterative reconstruction technique. COMPARISON:  CT brain 12/01/2021, MRI 12/08/2021 FINDINGS: Brain: No acute territorial infarction, hemorrhage, or intracranial mass. The ventricles are nonenlarged. Vascular: No hyperdense vessels.  Carotid vascular calcification. Skull: No fracture Sinuses/Orbits: Completely opacified right maxillary sinus, right frontal sinus and anterior ethmoid sinuses. Differential fluid level in the right maxillary sinus incompletely visualized. This is chronic. Other: None IMPRESSION: No CT evidence  for acute intracranial abnormality. Chronic right sinus disease Electronically Signed   By: Donavan Foil M.D.   On: 02/10/2022 17:23   DG Chest 2 View  Result Date: 02/10/2022 CLINICAL DATA:  Provided history: Syncope, heart block.  Dizziness. EXAM: CHEST - 2 VIEW COMPARISON:  Chest radiographs 07/26/2018. Report from chest radiograph 03/08/2020 (images unavailable). Chest CT 07/26/2018. FINDINGS: Heart size within normal limits. Aortic atherosclerosis. Mild atelectasis within the bilateral lung bases. No appreciable airspace consolidation or pulmonary edema. No evidence of pleural effusion or pneumothorax. Mild elevation of the right hemidiaphragm. No acute bony abnormality identified. IMPRESSION: No appreciable airspace consolidation or pulmonary edema. Mild atelectasis within the bilateral lung bases. Mild elevation of the right hemidiaphragm. Aortic Atherosclerosis (ICD10-I70.0). Electronically Signed   By: Kellie Simmering D.O.   On: 02/10/2022 15:50    Patient Profile     Renee Meyer is a 81 y.o. female with a hx of COPD, GERD, hyperlipidemia who is being seen 02/11/2022 for the evaluation of syncope at the request of Dr. Saunders Revel.  Assessment & Plan  SND Syncope Had more pauses upon arrival, but has been mostly stable overnight Clear liquid diet at midnight, NPO after breakfast.  Explained risks, benefits, and alternatives to PPM implantation, including but not limited to bleeding, infection, pneumothorax, pericardial effusion, lead dislodgement, heart attack, stroke, or death.  Pt verbalized understanding and agrees to proceed if indicated.   Dr. Quentin Ore has seen.   For questions or updates, please contact [place] Please consult [email or web address] for contact info under Cardiology/STEMI.  Signed, Shirley Friar, PA-C  02/12/2022, 8:03 AM

## 2022-02-12 NOTE — Assessment & Plan Note (Signed)
#)   GERD: documented h/o such; on omeprazole as outpatient.  ?  ?Plan: continue home PPI.  ?  ?

## 2022-02-12 NOTE — Assessment & Plan Note (Signed)
 #)   Chronic anemia: Documented history of such, with documentation of a contribution from chronic vitamin B12 deficiency, on chronic daily oral B12 supplementation.  Associated with baseline hemoglobin range of 10-12, with presenting labs consistent with his baseline range.  No overt evidence of active bleed at this time.  Plan: Repeat CBC in the morning.  Continue outpatient daily oral B12 supplementation.

## 2022-02-13 ENCOUNTER — Inpatient Hospital Stay (HOSPITAL_COMMUNITY)
Admission: AD | Disposition: A | Discharge status: Home / Self Care | Payer: Self-pay | Source: Other Acute Inpatient Hospital | Attending: Internal Medicine

## 2022-02-13 ENCOUNTER — Other Ambulatory Visit: Payer: Self-pay

## 2022-02-13 ENCOUNTER — Ambulatory Visit (HOSPITAL_COMMUNITY): Admission: RE | Admit: 2022-02-13 | Payer: PPO | Source: Home / Self Care | Admitting: Cardiology

## 2022-02-13 DIAGNOSIS — I495 Sick sinus syndrome: Principal | ICD-10-CM

## 2022-02-13 DIAGNOSIS — R001 Bradycardia, unspecified: Secondary | ICD-10-CM | POA: Diagnosis not present

## 2022-02-13 HISTORY — PX: PACEMAKER IMPLANT: EP1218

## 2022-02-13 SURGERY — PACEMAKER IMPLANT

## 2022-02-13 MED ORDER — ONDANSETRON HCL 4 MG/2ML IJ SOLN
4.0000 mg | Freq: Four times a day (QID) | INTRAMUSCULAR | Status: DC | PRN
Start: 2022-02-13 — End: 2022-02-14

## 2022-02-13 MED ORDER — FENTANYL CITRATE (PF) 100 MCG/2ML IJ SOLN
INTRAMUSCULAR | Status: DC | PRN
  Administered 2022-02-13 (×2): 25 ug via INTRAVENOUS

## 2022-02-13 MED ORDER — MIDAZOLAM HCL 5 MG/5ML IJ SOLN
INTRAMUSCULAR | Status: AC
  Filled 2022-02-13: qty 5

## 2022-02-13 MED ORDER — HEPARIN (PORCINE) IN NACL 1000-0.9 UT/500ML-% IV SOLN
INTRAVENOUS | Status: DC | PRN
  Administered 2022-02-13: 500 mL

## 2022-02-13 MED ORDER — VANCOMYCIN HCL IN DEXTROSE 1-5 GM/200ML-% IV SOLN
INTRAVENOUS | Status: AC
  Filled 2022-02-13: qty 200

## 2022-02-13 MED ORDER — FENTANYL CITRATE (PF) 100 MCG/2ML IJ SOLN
INTRAMUSCULAR | Status: AC
  Filled 2022-02-13: qty 2

## 2022-02-13 MED ORDER — LIDOCAINE HCL (PF) 1 % IJ SOLN
INTRAMUSCULAR | Status: AC
  Filled 2022-02-13: qty 60

## 2022-02-13 MED ORDER — HEPARIN (PORCINE) IN NACL 1000-0.9 UT/500ML-% IV SOLN
INTRAVENOUS | Status: AC
  Filled 2022-02-13: qty 500

## 2022-02-13 MED ORDER — MIDAZOLAM HCL 5 MG/5ML IJ SOLN
INTRAMUSCULAR | Status: DC | PRN
  Administered 2022-02-13 (×2): 1 mg via INTRAVENOUS

## 2022-02-13 MED ORDER — SODIUM CHLORIDE 0.9 % IV SOLN
INTRAVENOUS | Status: AC
  Filled 2022-02-13: qty 2

## 2022-02-13 SURGICAL SUPPLY — 13 items
CABLE SURGICAL S-101-97-12 (CABLE) ×2 IMPLANT
CATH CPS LOCATOR 3D SM (CATHETERS) ×1 IMPLANT
HELIX LOCKING TOOL (MISCELLANEOUS) ×2
LEAD TENDRIL MRI 52CM LPA1200M (Lead) ×1 IMPLANT
LEAD TENDRIL SDX 2088TC-58CM (Lead) ×1 IMPLANT
PACEMAKER ASSURITY DR-RF (Pacemaker) ×1 IMPLANT
PAD DEFIB RADIO PHYSIO CONN (PAD) ×2 IMPLANT
SHEATH 8FR PRELUDE SNAP 13 (SHEATH) ×2 IMPLANT
SHEATH 9FR PRELUDE SNAP 13 (SHEATH) ×1 IMPLANT
SLITTER AGILIS HISPRO (INSTRUMENTS) ×1 IMPLANT
TOOL HELIX LOCKING (MISCELLANEOUS) IMPLANT
TRAY PACEMAKER INSERTION (PACKS) ×2 IMPLANT
WIRE HI TORQ VERSACORE-J 145CM (WIRE) ×1 IMPLANT

## 2022-02-13 NOTE — Progress Notes (Addendum)
Electrophysiology Rounding Note  Patient Name: Renee Meyer Date of Encounter: 02/13/2022  Primary Cardiologist: Nelva Bush, MD Electrophysiologist: None   Subjective   The patient is doing well today.  At this time, the patient denies chest pain, shortness of breath, or any new concerns.  Inpatient Medications    Scheduled Meds:  amLODipine  5 mg Oral QHS   gentamicin (GARAMYCIN) 80 mg in sodium chloride 0.9 % 500 mL irrigation  80 mg Irrigation On Call   loratadine  10 mg Oral Daily   pantoprazole  40 mg Oral Daily   pravastatin  40 mg Oral Daily   vitamin B-12  1,000 mcg Oral Daily   Continuous Infusions:  sodium chloride     sodium chloride 50 mL/hr at 02/13/22 0622   vancomycin     PRN Meds: acetaminophen **OR** acetaminophen, albuterol   Vital Signs    Vitals:   02/12/22 1506 02/12/22 1915 02/12/22 2329 02/13/22 0256  BP: (!) 153/72 (!) 157/72 (!) 141/55 (!) 158/73  Pulse: 78 87 60 80  Resp: '20 19 14 13  '$ Temp: 98.3 F (36.8 C) 97.9 F (36.6 C) 98.7 F (37.1 C) 98.4 F (36.9 C)  TempSrc: Oral Oral Oral Oral  SpO2: 91% 93% 91% 94%  Weight:    95.8 kg    Intake/Output Summary (Last 24 hours) at 02/13/2022 0812 Last data filed at 02/12/2022 1915 Gross per 24 hour  Intake --  Output 0 ml  Net 0 ml   Filed Weights   02/12/22 0632 02/13/22 0256  Weight: 96.3 kg 95.8 kg    Physical Exam    GEN- The patient is well appearing, alert and oriented x 3 today.   Head- normocephalic, atraumatic Eyes-  Sclera clear, conjunctiva pink Ears- hearing intact Oropharynx- clear Neck- supple Lungs- Clear to ausculation bilaterally, normal work of breathing Heart- Regular rate and rhythm, no murmurs, rubs or gallops GI- soft, NT, ND, + BS Extremities- no clubbing or cyanosis. No edema Skin- no rash or lesion Psych- euthymic mood, full affect Neuro- strength and sensation are intact  Labs    CBC Recent Labs    02/11/22 0502 02/12/22 0141  WBC 6.7  6.4  NEUTROABS  --  4.1  HGB 10.5* 10.7*  HCT 32.9* 33.8*  MCV 89.9 91.8  PLT 159 242   Basic Metabolic Panel Recent Labs    02/10/22 1455 02/11/22 0502 02/12/22 0141  NA  --  141 140  K  --  4.1 4.1  CL  --  109 111  CO2  --  26 21*  GLUCOSE  --  106* 104*  BUN  --  17 17  CREATININE  --  1.06* 1.13*  CALCIUM  --  9.3 9.2  MG 2.4  --  2.1   Liver Function Tests Recent Labs    02/12/22 0141  AST 12*  ALT 11  ALKPHOS 73  BILITOT 0.8  PROT 5.9*  ALBUMIN 3.1*   No results for input(s): "LIPASE", "AMYLASE" in the last 72 hours. Cardiac Enzymes No results for input(s): "CKTOTAL", "CKMB", "CKMBINDEX", "TROPONINI" in the last 72 hours.   Telemetry    NSR 60-80s, continues to have intermittent bradycardia and pauses (personally reviewed)  Radiology    ECHOCARDIOGRAM COMPLETE  Result Date: 02/11/2022    ECHOCARDIOGRAM REPORT   Patient Name:   Renee Meyer Date of Exam: 02/11/2022 Medical Rec #:  [id number]      Height:  64.0 in Accession #:    [id number]     Weight:       217.3 lb Date of Birth:  [birth year]      BSA:          2.027 m Patient Age:    81 years       BP:           141/56 mmHg Patient Gender: F              HR:           55 bpm. Exam Location:  [facility] Procedure: 2D Echo, Color Doppler and Cardiac Doppler Indications:     R55 Syncope  History:         Patient has no prior history of Echocardiogram examinations.                  COPD; Risk Factors:Dyslipidemia.  Sonographer:     Charmayne Sheer Referring Phys:  [id number] AMY N [facility] Diagnosing Phys: Kate Sable MD  Sonographer Comments: Suboptimal parasternal window. Image acquisition challenging due to COPD. IMPRESSIONS  1. Left ventricular ejection fraction, by estimation, is 55 to 60%. The left ventricle has normal function. The left ventricle has no regional wall motion abnormalities. Left ventricular diastolic parameters were normal.  2. Right ventricular systolic function is normal. The right ventricular size is  normal.  3. The mitral valve is normal in structure. Mild mitral valve regurgitation.  4. The aortic valve was not well visualized. Aortic valve regurgitation is not visualized.  5. The inferior vena cava is dilated in size with >50% respiratory variability, suggesting right atrial pressure of 8 mmHg. FINDINGS  Left Ventricle: Left ventricular ejection fraction, by estimation, is 55 to 60%. The left ventricle has normal function. The left ventricle has no regional wall motion abnormalities. The left ventricular internal cavity size was normal in size. There is  no left ventricular hypertrophy. Left ventricular diastolic parameters were normal. Right Ventricle: The right ventricular size is normal. No increase in right ventricular wall thickness. Right ventricular systolic function is normal. Left Atrium: Left atrial size was normal in size. Right Atrium: Right atrial size was normal in size. Pericardium: There is no evidence of pericardial effusion. Mitral Valve: The mitral valve is normal in structure. Mild mitral valve regurgitation. Tricuspid Valve: The tricuspid valve is grossly normal. Tricuspid valve regurgitation is not demonstrated. Aortic Valve: The aortic valve was not well visualized. Aortic valve regurgitation is not visualized. Aortic valve mean gradient measures 8.0 mmHg. Aortic valve peak gradient measures 16.2 mmHg. Aortic valve area, by VTI measures 1.56 cm. Pulmonic Valve: The pulmonic valve was not well visualized. Pulmonic valve regurgitation is not visualized. Aorta: The aortic root is normal in size and structure. Venous: The inferior vena cava is dilated in size with greater than 50% respiratory variability, suggesting right atrial pressure of 8 mmHg. IAS/Shunts: No atrial level shunt detected by color flow Doppler.  LEFT VENTRICLE PLAX 2D LVIDd:         4.31 cm   Diastology LVIDs:         3.38 cm   LV e' medial:    7.83 cm/s LV PW:         0.95 cm   LV E/e' medial:  11.3 LV IVS:        1.00  cm   LV e' lateral:   9.90 cm/s LVOT diam:     1.90 cm   LV E/e' lateral: 9.0 LV  SV:         73 LV SV Index:   36 LVOT Area:     2.84 cm  RIGHT VENTRICLE RV Basal diam:  3.90 cm RV S prime:     15.30 cm/s TAPSE (M-mode): 2.3 cm LEFT ATRIUM             Index        RIGHT ATRIUM           Index LA diam:        3.20 cm 1.58 cm/m   RA Area:     13.70 cm LA Vol (A2C):   36.1 ml 17.81 ml/m  RA Volume:   35.50 ml  17.52 ml/m LA Vol (A4C):   36.2 ml 17.86 ml/m LA Biplane Vol: 37.0 ml 18.26 ml/m  AORTIC VALVE AV Area (Vmax):    1.69 cm AV Area (Vmean):   1.58 cm AV Area (VTI):     1.56 cm AV Vmax:           201.00 cm/s AV Vmean:          133.000 cm/s AV VTI:            0.464 m AV Peak Grad:      16.2 mmHg AV Mean Grad:      8.0 mmHg LVOT Vmax:         120.00 cm/s LVOT Vmean:        73.900 cm/s LVOT VTI:          0.256 m LVOT/AV VTI ratio: 0.55  AORTA Ao Root diam: 2.90 cm MITRAL VALVE MV Area (PHT): 3.30 cm    SHUNTS MV Decel Time: 230 msec    Systemic VTI:  0.26 m MV E velocity: 88.70 cm/s  Systemic Diam: 1.90 cm MV A velocity: 87.80 cm/s MV E/A ratio:  1.01 Kate Sable MD Electronically signed by Kate Sable MD Signature Date/Time: 02/11/2022/12:37:34 PM    Final     Patient Profile     KADYNCE BONDS is a 81 y.o. female with a hx of COPD, GERD, hyperlipidemia who is being seen 02/11/2022 for the evaluation of syncope at the request of Dr. Saunders Revel.  Assessment & Plan    SND Syncope Continues to have intermittent pauses and bradycardia.  Plan for pacemaker this afternoon. Risks and benefits have been reviewed.  Usual follow up and AVS instructions have been placed for weekend team.   For questions or updates, please contact [place] Please consult [email or web address] for contact info under Cardiology/STEMI.  Signed, Shirley Friar, PA-C  02/13/2022, 8:12 AM

## 2022-02-13 NOTE — Care Management Important Message (Signed)
Important Message  Patient Details  Name: Renee Meyer MRN: [id number] Date of Birth: 12/03/1940   Medicare Important Message Given:  Yes     [name] 02/13/2022, 4:11 PM
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
## 2022-02-14 ENCOUNTER — Inpatient Hospital Stay (HOSPITAL_COMMUNITY): Payer: PPO

## 2022-02-14 DIAGNOSIS — R001 Bradycardia, unspecified: Secondary | ICD-10-CM | POA: Diagnosis not present

## 2022-02-14 MED ORDER — METOLAZONE 2.5 MG PO TABS: 2.5000 mg | ORAL_TABLET | ORAL | Status: AC

## 2022-02-14 MED ORDER — ORAL CARE MOUTH RINSE: 15.0000 mL | OROMUCOSAL | Status: DC | PRN

## 2022-02-14 MED ORDER — METOPROLOL SUCCINATE ER 25 MG PO TB24
25.0000 mg | ORAL_TABLET | Freq: Every day | ORAL | Status: DC
  Administered 2022-02-14: 25 mg via ORAL
  Filled 2022-02-14: qty 1

## 2022-02-14 MED ORDER — METOPROLOL SUCCINATE ER 25 MG PO TB24: 25.0000 mg | ORAL_TABLET | Freq: Two times a day (BID) | ORAL | 2 refills | Status: DC

## 2022-02-14 MED ORDER — FUROSEMIDE 20 MG PO TABS
20.0000 mg | ORAL_TABLET | Freq: Once | ORAL | Status: AC
  Administered 2022-02-14: 20 mg via ORAL
  Filled 2022-02-14: qty 1

## 2022-02-14 MED ORDER — FUROSEMIDE 20 MG PO TABS: 40.0000 mg | ORAL_TABLET | Freq: Every day | ORAL | 0 refills | Status: AC

## 2022-02-14 NOTE — Progress Notes (Signed)
Pt got discharged to home, discharge instructions provided and patient showed understanding to it, IV taken out,Telemonitor DC,pt left unit in wheelchair with all of the belongings accompanied with a family member.  Saquan Furtick, RN 
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
## 2022-02-14 NOTE — Plan of Care (Signed)

## 2022-02-14 NOTE — Progress Notes (Signed)
Patient Name: Renee Meyer      SUBJECTIVE:admiited for pacemaker for syncope and sinus node dysfunction   Without complaint  Past Medical History:  Diagnosis Date   Anemia    Cancer ([place]) in her 30s   basal cell carcinoma   Colon polyps 2012   COPD (chronic obstructive pulmonary disease) ([place])    Cystitis 2013   Dyspnea    Dysrhythmia    GERD (gastroesophageal reflux disease)    Heart murmur    Hyperlipidemia    Pneumonia     Scheduled Meds:  Scheduled Meds:  amLODipine  5 mg Oral QHS   furosemide  20 mg Oral Once   loratadine  10 mg Oral Daily   metoprolol succinate  25 mg Oral Daily   pantoprazole  40 mg Oral Daily   pravastatin  40 mg Oral Daily   vitamin B-12  1,000 mcg Oral Daily   Continuous Infusions: acetaminophen **OR** acetaminophen, albuterol, ondansetron (ZOFRAN) IV, mouth rinse    PHYSICAL EXAM Vitals:   02/14/22 0400 02/14/22 0607 02/14/22 0754 02/14/22 1145  BP: 137/66  (!) 142/82 (!) 157/69  Pulse: 60  62 66  Resp: '12  14 14  '$ Temp: 97.9 F (36.6 C)  98 F (36.7 C) 98 F (36.7 C)  TempSrc: Oral  Oral Oral  SpO2: 93%  95% 100%  Weight:  95.8 kg    Height:        Well developed and nourished in no acute distress HENT normal Neck supple  Pocket without  hematoma, swelling or tenderness  Clear Regular rate and rhythm, no murmurs or gallops Abd-soft with active BS No Clubbing cyanosis edema Skin-warm and dry A & Oriented  Grossly normal sensory and motor function  ECG sinus    TELEMETRY: Reviewed personnally pt in sinus:  ECG personally reviewed    Intake/Output Summary (Last 24 hours) at 02/14/2022 1159 Last data filed at 02/14/2022 0754 Gross per 24 hour  Intake 600 ml  Output 1000 ml  Net -400 ml    LABS: Basic Metabolic Panel: Recent Labs  Lab 02/10/22 1445 02/10/22 1455 02/11/22 0502 02/12/22 0141  NA 140  --  141 140  K 4.6  --  4.1 4.1  CL 107  --  109 111  CO2 24  --  26 21*  GLUCOSE 93  --   106* 104*  BUN 20  --  17 17  CREATININE 1.08*  --  1.06* 1.13*  CALCIUM 9.1  --  9.3 9.2  MG  --  2.4  --  2.1   Cardiac Enzymes: No results for input(s): "CKTOTAL", "CKMB", "CKMBINDEX", "TROPONINI" in the last 72 hours. CBC: Recent Labs  Lab 02/10/22 1445 02/11/22 0502 02/12/22 0141  WBC 9.2 6.7 6.4  NEUTROABS  --   --  4.1  HGB 11.0* 10.5* 10.7*  HCT 35.7* 32.9* 33.8*  MCV 91.1 89.9 91.8  PLT PLATELET CLUMPS NOTED ON SMEAR, UNABLE TO ESTIMATE 159 154   PROTIME: No results for input(s): "LABPROT", "INR" in the last 72 hours. Liver Function Tests: Recent Labs    02/12/22 0141  AST 12*  ALT 11  ALKPHOS 73  BILITOT 0.8  PROT 5.9*  ALBUMIN 3.1*   No results for input(s): "LIPASE", "AMYLASE" in the last 72 hours. BNP: BNP (last 3 results) Recent Labs    02/10/22 1514  BNP 45.4       Device Interrogation:normal device function with relatively low  R wave  will follow  CXR lead position reasonable    ASSESSMENT AND PLAN:  Principal Problem:   Bradycardia Active Problems:   Mixed hyperlipidemia   GERD (gastroesophageal reflux disease)   Syncope   Essential hypertension   Stage 3a chronic kidney disease (CKD) (HCC)   Allergic rhinitis   Chronic anemia  Pacemaker placed yesterday  Instructions given Ok to discharge   Signed, Virl Axe MD  02/14/2022

## 2022-02-16 ENCOUNTER — Encounter (HOSPITAL_COMMUNITY): Payer: Self-pay | Admitting: Cardiology

## 2022-02-16 MED FILL — Lidocaine HCl Local Preservative Free (PF) Inj 1%: INTRAMUSCULAR | Qty: 30 | Status: AC

## 2022-02-23 DIAGNOSIS — R001 Bradycardia, unspecified: Secondary | ICD-10-CM | POA: Diagnosis not present

## 2022-02-23 DIAGNOSIS — Z950 Presence of cardiac pacemaker: Secondary | ICD-10-CM | POA: Diagnosis not present

## 2022-02-25 ENCOUNTER — Ambulatory Visit (INDEPENDENT_AMBULATORY_CARE_PROVIDER_SITE_OTHER): Payer: PPO

## 2022-02-25 DIAGNOSIS — I495 Sick sinus syndrome: Secondary | ICD-10-CM | POA: Diagnosis not present

## 2022-02-25 LAB — CUP PACEART INCLINIC DEVICE CHECK
Date Time Interrogation Session: 20230712105238
Implantable Lead Implant Date: 20230630
Implantable Lead Implant Date: 20230630
Implantable Lead Location: 753859
Implantable Lead Location: 753860
Implantable Pulse Generator Implant Date: 20230630
Pulse Gen Model: 2272
Pulse Gen Serial Number: 8073661

## 2022-02-25 NOTE — Patient Instructions (Signed)
   After Your Pacemaker   Monitor your pacemaker site for redness, swelling, and drainage. Call the device clinic at [phone] if you experience these symptoms or fever/chills.  Your incision was closed with Steri-strips or staples:  You may shower 7 days after your procedure and wash your incision with soap and water. Avoid lotions, ointments, or perfumes over your incision until it is well-healed.  You may use a hot tub or a pool after your wound check appointment if the incision is completely closed.  Do not lift, push or pull greater than 10 pounds with the affected arm until March 28 2022 There are no other restrictions in arm movement after your wound check appointment.  You may drive, unless driving has been restricted by your healthcare providers.  Remote monitoring is used to monitor your pacemaker from home. This monitoring is scheduled every 91 days by our office. It allows [place] to keep an eye on the functioning of your device to ensure it is working properly. You will routinely see your Electrophysiologist annually (more often if necessary).

## 2022-02-25 NOTE — Progress Notes (Signed)
Wound check appointment. Steri-strips removed. Wound without redness or edema. Incision edges approximated, wound well healed. Normal device function. Thresholds, sensing, and impedances consistent with implant measurements. Device programmed at 3.5V/auto capture programmed on for extra safety margin until 3 month visit. Histogram distribution appropriate for patient and level of activity. No mode switches or high ventricular rates noted. Patient educated about wound care, arm mobility, lifting restrictions. ROV 05/18/2022 with Dr. Quentin Ore.

## 2022-03-04 ENCOUNTER — Telehealth: Payer: Self-pay | Admitting: Cardiology

## 2022-03-04 NOTE — Telephone Encounter (Signed)
Patient called and mentioned that the place where her Pacemaker was put in was hurting. Want to talk with Dr. Quentin Ore or nurse

## 2022-03-04 NOTE — Telephone Encounter (Signed)
Spoke w/ pt.  She reports that she thinks she may have slept wrong last night, as her pacer implant site was painful this morning when she would raise her arm up. Pain was relieved when she would put her arm back down. She reports that it no longer hurts at all now. She denies heat, pain, or redness. Advised her to continue to monitor and call back if sx recur. She is appreciative of the call.

## 2022-05-18 ENCOUNTER — Ambulatory Visit (INDEPENDENT_AMBULATORY_CARE_PROVIDER_SITE_OTHER): Payer: PPO

## 2022-05-18 DIAGNOSIS — I495 Sick sinus syndrome: Secondary | ICD-10-CM | POA: Diagnosis not present

## 2022-05-18 LAB — CUP PACEART REMOTE DEVICE CHECK
Battery Remaining Longevity: 70 mo
Battery Remaining Percentage: 95.5 %
Battery Voltage: 3.01 V
Brady Statistic AP VP Percent: 1 %
Brady Statistic AP VS Percent: 88 %
Brady Statistic AS VP Percent: 1 %
Brady Statistic AS VS Percent: 12 %
Brady Statistic RA Percent Paced: 88 %
Brady Statistic RV Percent Paced: 1 %
Date Time Interrogation Session: 20231002020013
Implantable Lead Implant Date: 20230630
Implantable Lead Implant Date: 20230630
Implantable Lead Location: 753859
Implantable Lead Location: 753860
Implantable Pulse Generator Implant Date: 20230630
Lead Channel Impedance Value: 440 Ohm
Lead Channel Impedance Value: 530 Ohm
Lead Channel Pacing Threshold Amplitude: 0.75 V
Lead Channel Pacing Threshold Amplitude: 0.75 V
Lead Channel Pacing Threshold Pulse Width: 0.5 ms
Lead Channel Pacing Threshold Pulse Width: 0.5 ms
Lead Channel Sensing Intrinsic Amplitude: 3.2 mV
Lead Channel Sensing Intrinsic Amplitude: 4.4 mV
Lead Channel Setting Pacing Amplitude: 3.5 V
Lead Channel Setting Pacing Amplitude: 3.5 V
Lead Channel Setting Pacing Pulse Width: 0.5 ms
Lead Channel Setting Sensing Sensitivity: 0.5 mV
Pulse Gen Model: 2272
Pulse Gen Serial Number: 8073661

## 2022-05-22 ENCOUNTER — Encounter: Payer: PPO | Admitting: Cardiology

## 2022-05-26 NOTE — Progress Notes (Unsigned)
Electrophysiology Office Follow up Visit Note:    Date:  05/27/2022   ID:  Renee Meyer, DOB [birth year], MRN [id number]  PCP:  Rusty Aus, MD  [facility] Cardiologist:  Nelva Bush, MD  [facility] Electrophysiologist:  Vickie Epley, MD    Interval History:    Renee Meyer is a 81 y.o. female who presents for a follow up visit after PPM implant 02/13/2022. She has a DDD PPM with a left bundle area lead.  Remote checks after implant have shown stable device function.  She is done well since device implant and feels much better.      Past Medical History:  Diagnosis Date   Anemia    Cancer ([place]) in her 60s   basal cell carcinoma   Colon polyps 2012   COPD (chronic obstructive pulmonary disease) ([place])    Cystitis 2013   Dyspnea    Dysrhythmia    GERD (gastroesophageal reflux disease)    Heart murmur    Hyperlipidemia    Pneumonia     Past Surgical History:  Procedure Laterality Date   ABDOMINAL HYSTERECTOMY     BREAST SURGERY Right 07/31/2013   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   CATARACT EXTRACTION, BILATERAL     CESAREAN SECTION     COLONOSCOPY  2012   Dr Vira Agar   COLONOSCOPY WITH PROPOFOL N/A 09/21/2017   Procedure: COLONOSCOPY WITH PROPOFOL;  Surgeon: Tol[place], Benay Pike, MD;  Location: [facility] ENDOSCOPY;  Service: Gastroenterology;  Laterality: N/A;   FRACTURE SURGERY Left 2013   arm   HARDWARE REMOVAL Right 03/04/2021   Procedure: HARDWARE REMOVAL-RIGHT ANKLE;  Surgeon: Thornton Park, MD;  Location: [facility] ORS;  Service: Orthopedics;  Laterality: Right;   ORIF ANKLE FRACTURE Right 08/06/2016   Procedure: OPEN REDUCTION INTERNAL FIXATION (ORIF) ANKLE FRACTURE;  Surgeon: Thornton Park, MD;  Location: [facility] ORS;  Service: Orthopedics;  Laterality: Right;  SMALL FRAGMENT SET SYNTHES 4.0MM CANNULATED SCREW SET      PACEMAKER IMPLANT N/A 02/13/2022   Procedure: PACEMAKER IMPLANT;  Surgeon: Vickie Epley, MD;  Location: [place]  [facility];  Service: Cardiovascular;  Laterality: N/A;    Current Medications: Current Meds  Medication Sig   albuterol (VENTOLIN HFA) 108 (90 Base) MCG/ACT inhaler Inhale 2 puffs into the lungs every 4 (four) hours as needed.   amLODipine (NORVASC) 5 MG tablet Take 5 mg by mouth at bedtime.   azithromycin (ZITHROMAX) 250 MG tablet Take by mouth.   Calcium Carbonate+Vitamin D 600-200 MG-UNIT TABS Take 1 tablet by mouth daily.   Cholecalciferol (D3 DOTS) 50 MCG (2000 UT) TBDP Take 2,000 Units by mouth daily.   furosemide (LASIX) 20 MG tablet Take 2 tablets (40 mg total) by mouth daily.   GLUCOSAMINE-CALCIUM-VIT D PO Take 1 tablet by mouth daily.   loratadine (CLARITIN) 10 MG tablet Take 10 mg by mouth daily.   Magnesium Oxide 250 MG TABS Take 250 mg by mouth daily.   meclizine (ANTIVERT) 25 MG tablet Take 25 mg by mouth 3 (three) times daily as needed for dizziness.   metolazone (ZAROXOLYN) 2.5 MG tablet Take 1 tablet (2.5 mg total) by mouth every 3 (three) days.   metoprolol succinate (TOPROL-XL) 50 MG 24 hr tablet Take 50 mg by mouth 2 (two) times daily.   omeprazole (PRILOSEC) 20 MG capsule Take 20 mg by mouth daily.   pravastatin (PRAVACHOL) 40 MG tablet Take 40 mg by mouth daily.   vitamin B-12 (CYANOCOBALAMIN) 1000 MCG  tablet Take 1,000 mcg by mouth daily.   vitamin E 180 MG (400 UNITS) capsule Take 400 Units by mouth daily.     Allergies:   Simvastatin, Sulfacetamide sodium, Isosorbide nitrate, Amoxicillin, Bisoprolol, Clindamycin/lincomycin, Codeine, Keflex [cephalexin], Penicillins, and Sulfa antibiotics   Social History   Socioeconomic History   Marital status: Married    Spouse name: Not on file   Number of children: Not on file   Years of education: Not on file   Highest education level: Not on file  Occupational History   Not on file  Tobacco Use   Smoking status: Former    Types: Cigarettes    Quit date: 08/05/1996    Years since quitting: 25.8   Smokeless tobacco:  Never  Substance and Sexual Activity   Alcohol use: No   Drug use: No   Sexual activity: Not Currently  Other Topics Concern   Not on file  Social History Narrative   Not on file   Social Determinants of Health   Financial Resource Strain: Not on file  Food Insecurity: Not on file  Transportation Needs: Not on file  Physical Activity: Not on file  Stress: Not on file  Social Connections: Not on file     Family History: The patient's family history includes Colon cancer in her sister.  ROS:   Please see the history of present illness.    All other systems reviewed and are negative.  EKGs/Labs/Other Studies Reviewed:    The following studies were reviewed today:  05/27/2022 in clinic device interrogation personally reviewed Battery longevity 9.6 years Lead parameters stable Reprogrammed lead outputs to maximize battery longevity today 88% atrial pacing Less than 1% ventricular pacing 0% mode switch  EKG:  The ekg ordered today demonstrates a paced, V sensed at 63 bpm  Recent Labs: 02/10/2022: B Natriuretic Peptide 45.4; TSH 1.605 02/12/2022: ALT 11; BUN 17; Creatinine, Ser 1.13; Hemoglobin 10.7; Magnesium 2.1; Platelets 154; Potassium 4.1; Sodium 140  Recent Lipid Panel No results found for: "CHOL", "TRIG", "HDL", "CHOLHDL", "VLDL", "LDLCALC", "LDLDIRECT"  Physical Exam:    VS:  BP (!) 158/82   Pulse 70   Ht '5\' 4"'$  (1.626 m)   Wt 225 lb (102.1 kg)   SpO2 95%   BMI 38.62 kg/m     Wt Readings from Last 3 Encounters:  05/27/22 225 lb (102.1 kg)  02/14/22 211 lb 3.2 oz (95.8 kg)  02/10/22 217 lb 4.8 oz (98.6 kg)     GEN:  Well nourished, well developed in no acute distress HEENT: Normal NECK: No JVD; No carotid bruits LYMPHATICS: No lymphadenopathy CARDIAC: RRR, no murmurs, rubs, gallops.  Pacemaker pocket well-healed RESPIRATORY:  Clear to auscultation without rales, wheezing or rhonchi  ABDOMEN: Soft, non-tender, non-distended MUSCULOSKELETAL:  No edema;  No deformity  SKIN: Warm and dry NEUROLOGIC:  Alert and oriented x 3 PSYCHIATRIC:  Normal affect        ASSESSMENT:    1. Sinus node dysfunction (HCC)   2. Cardiac pacemaker in situ    PLAN:    In order of problems listed above:   #Sinus node dysfunction #Pacemaker in situ Device functioning appropriately.  Continue remote monitoring.   #Hypertension Above goal today.  Recommend checking blood pressures 1-2 times per week at home and recording the values.  Recommend bringing these recordings to the primary care physician. She has a primary care appointment around December 1.  I encouraged her to bring those blood pressure recordings to the primary  care physician for further medication regimen titration.  Follow-up 1 year or sooner as needed         Medication Adjustments/Labs and Tests Ordered: Current medicines are reviewed at length with the patient today.  Concerns regarding medicines are outlined above.  Orders Placed This Encounter  Procedures   EKG 12-Lead   No orders of the defined types were placed in this encounter.    Signed, Lars Mage, MD, [facility], [facility] 05/27/2022 9:48 AM    Electrophysiology [facility]

## 2022-05-27 ENCOUNTER — Ambulatory Visit: Payer: PPO | Attending: Cardiology | Admitting: Cardiology

## 2022-05-27 ENCOUNTER — Encounter: Payer: Self-pay | Admitting: Cardiology

## 2022-05-27 VITALS — BP 150/80 | HR 70 | Ht 64.0 in | Wt 225.0 lb

## 2022-05-27 DIAGNOSIS — Z950 Presence of cardiac pacemaker: Secondary | ICD-10-CM | POA: Diagnosis not present

## 2022-05-27 DIAGNOSIS — I495 Sick sinus syndrome: Secondary | ICD-10-CM

## 2022-05-27 LAB — CUP PACEART INCLINIC DEVICE CHECK
Battery Remaining Longevity: 115 mo
Battery Voltage: 3.01 V
Brady Statistic RA Percent Paced: 88 %
Brady Statistic RV Percent Paced: 0.02 %
Date Time Interrogation Session: 20231011120512
Implantable Lead Implant Date: 20230630
Implantable Lead Implant Date: 20230630
Implantable Lead Location: 753859
Implantable Lead Location: 753860
Implantable Pulse Generator Implant Date: 20230630
Lead Channel Impedance Value: 387.5 Ohm
Lead Channel Impedance Value: 537.5 Ohm
Lead Channel Pacing Threshold Amplitude: 0.5 V
Lead Channel Pacing Threshold Amplitude: 0.5 V
Lead Channel Pacing Threshold Amplitude: 0.75 V
Lead Channel Pacing Threshold Amplitude: 0.75 V
Lead Channel Pacing Threshold Pulse Width: 0.5 ms
Lead Channel Pacing Threshold Pulse Width: 0.5 ms
Lead Channel Pacing Threshold Pulse Width: 0.5 ms
Lead Channel Pacing Threshold Pulse Width: 0.5 ms
Lead Channel Sensing Intrinsic Amplitude: 3 mV
Lead Channel Sensing Intrinsic Amplitude: 3.2 mV
Lead Channel Setting Pacing Amplitude: 2 V
Lead Channel Setting Pacing Amplitude: 2.5 V
Lead Channel Setting Pacing Pulse Width: 0.5 ms
Lead Channel Setting Sensing Sensitivity: 0.5 mV
Pulse Gen Model: 2272
Pulse Gen Serial Number: 8073661

## 2022-05-27 NOTE — Patient Instructions (Addendum)
Medication Instructions:  None *If you need a refill on your cardiac medications before your next appointment, please call your pharmacy*   Lab Work: none If you have labs (blood work) drawn today and your tests are completely normal, you will receive your results only by: MyChart Message (if you have MyChart) OR A paper copy in the mail If you have any lab test that is abnormal or we need to change your treatment, we will call you to review the results.   Testing/Procedures: none   Follow-Up: At [facility], you and your health needs are our priority.  As part of our continuing mission to provide you with exceptional heart care, we have created designated Provider Care Teams.  These Care Teams include your primary Cardiologist (physician) and Advanced Practice Providers (APPs -  Physician Assistants and Nurse Practitioners) who all work together to provide you with the care you need, when you need it.  We recommend signing up for the patient portal called "MyChart".  Sign up information is provided on this After Visit Summary.  MyChart is used to connect with patients for Virtual Visits (Telemedicine).  Patients are able to view lab/test results, encounter notes, upcoming appointments, etc.  Non-urgent messages can be sent to your provider as well.   To learn more about what you can do with MyChart, go to [email or web address].    Your next appointment:   1 year(s)  The format for your next appointment:   In Person  Provider:   You will see one of the following Advanced Practice Providers on your designated Care Team:   Christopher Berge, NP Ryan Dunn, PA-C Cadence Furth, PA-C Sheri Hammock, NP      Other Instructions None   Important Information About [place]       

## 2022-06-02 NOTE — Progress Notes (Signed)
Remote pacemaker transmission.   

## 2022-06-15 ENCOUNTER — Encounter (INDEPENDENT_AMBULATORY_CARE_PROVIDER_SITE_OTHER): Payer: Self-pay

## 2022-06-22 DIAGNOSIS — Z23 Encounter for immunization: Secondary | ICD-10-CM | POA: Diagnosis not present

## 2022-06-22 DIAGNOSIS — M76899 Other specified enthesopathies of unspecified lower limb, excluding foot: Secondary | ICD-10-CM | POA: Diagnosis not present

## 2022-07-28 DIAGNOSIS — R739 Hyperglycemia, unspecified: Secondary | ICD-10-CM | POA: Diagnosis not present

## 2022-07-28 DIAGNOSIS — I2781 Cor pulmonale (chronic): Secondary | ICD-10-CM | POA: Diagnosis not present

## 2022-08-12 DIAGNOSIS — S81801A Unspecified open wound, right lower leg, initial encounter: Secondary | ICD-10-CM | POA: Diagnosis not present

## 2022-08-12 DIAGNOSIS — L03115 Cellulitis of right lower limb: Secondary | ICD-10-CM | POA: Diagnosis not present

## 2022-08-18 ENCOUNTER — Ambulatory Visit (INDEPENDENT_AMBULATORY_CARE_PROVIDER_SITE_OTHER): Payer: PPO

## 2022-08-18 DIAGNOSIS — I495 Sick sinus syndrome: Secondary | ICD-10-CM

## 2022-08-18 LAB — CUP PACEART REMOTE DEVICE CHECK
Battery Remaining Longevity: 108 mo
Battery Remaining Percentage: 95.5 %
Battery Voltage: 3.02 V
Brady Statistic AP VP Percent: 1 %
Brady Statistic AP VS Percent: 91 %
Brady Statistic AS VP Percent: 1 %
Brady Statistic AS VS Percent: 9.2 %
Brady Statistic RA Percent Paced: 91 %
Brady Statistic RV Percent Paced: 1 %
Date Time Interrogation Session: 20240101020013
Implantable Lead Connection Status: 753985
Implantable Lead Connection Status: 753985
Implantable Lead Implant Date: 20230630
Implantable Lead Implant Date: 20230630
Implantable Lead Location: 753859
Implantable Lead Location: 753860
Implantable Pulse Generator Implant Date: 20230630
Lead Channel Impedance Value: 450 Ohm
Lead Channel Impedance Value: 530 Ohm
Lead Channel Pacing Threshold Amplitude: 0.5 V
Lead Channel Pacing Threshold Amplitude: 0.75 V
Lead Channel Pacing Threshold Pulse Width: 0.5 ms
Lead Channel Pacing Threshold Pulse Width: 0.5 ms
Lead Channel Sensing Intrinsic Amplitude: 3.4 mV
Lead Channel Sensing Intrinsic Amplitude: 3.8 mV
Lead Channel Setting Pacing Amplitude: 2 V
Lead Channel Setting Pacing Amplitude: 2.5 V
Lead Channel Setting Pacing Pulse Width: 0.5 ms
Lead Channel Setting Sensing Sensitivity: 0.5 mV
Pulse Gen Model: 2272
Pulse Gen Serial Number: 8073661

## 2022-08-24 DIAGNOSIS — S81801D Unspecified open wound, right lower leg, subsequent encounter: Secondary | ICD-10-CM | POA: Diagnosis not present

## 2022-08-24 DIAGNOSIS — R600 Localized edema: Secondary | ICD-10-CM | POA: Diagnosis not present

## 2022-08-24 DIAGNOSIS — L03115 Cellulitis of right lower limb: Secondary | ICD-10-CM | POA: Diagnosis not present

## 2022-09-09 ENCOUNTER — Ambulatory Visit: Payer: PPO | Admitting: Internal Medicine

## 2022-09-16 DIAGNOSIS — M79672 Pain in left foot: Secondary | ICD-10-CM | POA: Diagnosis not present

## 2022-09-16 DIAGNOSIS — I442 Atrioventricular block, complete: Secondary | ICD-10-CM | POA: Diagnosis not present

## 2022-09-16 DIAGNOSIS — M79671 Pain in right foot: Secondary | ICD-10-CM | POA: Diagnosis not present

## 2022-09-16 DIAGNOSIS — I2781 Cor pulmonale (chronic): Secondary | ICD-10-CM | POA: Diagnosis not present

## 2022-09-16 DIAGNOSIS — J449 Chronic obstructive pulmonary disease, unspecified: Secondary | ICD-10-CM | POA: Diagnosis not present

## 2022-09-16 DIAGNOSIS — L97912 Non-pressure chronic ulcer of unspecified part of right lower leg with fat layer exposed: Secondary | ICD-10-CM | POA: Diagnosis not present

## 2022-09-16 DIAGNOSIS — R739 Hyperglycemia, unspecified: Secondary | ICD-10-CM | POA: Diagnosis not present

## 2022-09-16 DIAGNOSIS — E538 Deficiency of other specified B group vitamins: Secondary | ICD-10-CM | POA: Diagnosis not present

## 2022-09-16 DIAGNOSIS — E782 Mixed hyperlipidemia: Secondary | ICD-10-CM | POA: Diagnosis not present

## 2022-09-16 DIAGNOSIS — Z0000 Encounter for general adult medical examination without abnormal findings: Secondary | ICD-10-CM | POA: Diagnosis not present

## 2022-09-17 NOTE — Progress Notes (Signed)
Remote pacemaker transmission.   

## 2022-09-23 DIAGNOSIS — I2781 Cor pulmonale (chronic): Secondary | ICD-10-CM | POA: Diagnosis not present

## 2022-09-23 DIAGNOSIS — L97912 Non-pressure chronic ulcer of unspecified part of right lower leg with fat layer exposed: Secondary | ICD-10-CM | POA: Diagnosis not present

## 2022-09-30 DIAGNOSIS — L97912 Non-pressure chronic ulcer of unspecified part of right lower leg with fat layer exposed: Secondary | ICD-10-CM | POA: Diagnosis not present

## 2022-09-30 DIAGNOSIS — I2781 Cor pulmonale (chronic): Secondary | ICD-10-CM | POA: Diagnosis not present

## 2022-10-07 DIAGNOSIS — L97912 Non-pressure chronic ulcer of unspecified part of right lower leg with fat layer exposed: Secondary | ICD-10-CM | POA: Diagnosis not present

## 2022-10-07 DIAGNOSIS — I2781 Cor pulmonale (chronic): Secondary | ICD-10-CM | POA: Diagnosis not present

## 2022-10-14 DIAGNOSIS — I2781 Cor pulmonale (chronic): Secondary | ICD-10-CM | POA: Diagnosis not present

## 2022-10-14 DIAGNOSIS — L97912 Non-pressure chronic ulcer of unspecified part of right lower leg with fat layer exposed: Secondary | ICD-10-CM | POA: Diagnosis not present

## 2022-10-15 DIAGNOSIS — S52572A Other intraarticular fracture of lower end of left radius, initial encounter for closed fracture: Secondary | ICD-10-CM | POA: Diagnosis not present

## 2022-10-15 DIAGNOSIS — S6992XA Unspecified injury of left wrist, hand and finger(s), initial encounter: Secondary | ICD-10-CM | POA: Diagnosis not present

## 2022-10-15 DIAGNOSIS — W010XXA Fall on same level from slipping, tripping and stumbling without subsequent striking against object, initial encounter: Secondary | ICD-10-CM | POA: Diagnosis not present

## 2022-10-15 DIAGNOSIS — Y92009 Unspecified place in unspecified non-institutional (private) residence as the place of occurrence of the external cause: Secondary | ICD-10-CM | POA: Diagnosis not present

## 2022-10-15 DIAGNOSIS — M7989 Other specified soft tissue disorders: Secondary | ICD-10-CM | POA: Diagnosis not present

## 2022-10-20 DIAGNOSIS — S52572A Other intraarticular fracture of lower end of left radius, initial encounter for closed fracture: Secondary | ICD-10-CM | POA: Diagnosis not present

## 2022-10-27 DIAGNOSIS — S52572D Other intraarticular fracture of lower end of left radius, subsequent encounter for closed fracture with routine healing: Secondary | ICD-10-CM | POA: Diagnosis not present

## 2022-11-10 DIAGNOSIS — S52572D Other intraarticular fracture of lower end of left radius, subsequent encounter for closed fracture with routine healing: Secondary | ICD-10-CM | POA: Diagnosis not present

## 2022-11-17 ENCOUNTER — Ambulatory Visit (INDEPENDENT_AMBULATORY_CARE_PROVIDER_SITE_OTHER): Payer: PPO

## 2022-11-17 DIAGNOSIS — I495 Sick sinus syndrome: Secondary | ICD-10-CM

## 2022-11-17 LAB — CUP PACEART REMOTE DEVICE CHECK
Battery Remaining Longevity: 106 mo
Battery Remaining Percentage: 94 %
Battery Voltage: 3.02 V
Brady Statistic AP VP Percent: 1 %
Brady Statistic AP VS Percent: 90 %
Brady Statistic AS VP Percent: 1 %
Brady Statistic AS VS Percent: 10 %
Brady Statistic RA Percent Paced: 89 %
Brady Statistic RV Percent Paced: 1 %
Date Time Interrogation Session: 20240401020013
Implantable Lead Connection Status: 753985
Implantable Lead Connection Status: 753985
Implantable Lead Implant Date: 20230630
Implantable Lead Implant Date: 20230630
Implantable Lead Location: 753859
Implantable Lead Location: 753860
Implantable Pulse Generator Implant Date: 20230630
Lead Channel Impedance Value: 450 Ohm
Lead Channel Impedance Value: 510 Ohm
Lead Channel Pacing Threshold Amplitude: 0.5 V
Lead Channel Pacing Threshold Amplitude: 0.75 V
Lead Channel Pacing Threshold Pulse Width: 0.5 ms
Lead Channel Pacing Threshold Pulse Width: 0.5 ms
Lead Channel Sensing Intrinsic Amplitude: 4 mV
Lead Channel Sensing Intrinsic Amplitude: 5.7 mV
Lead Channel Setting Pacing Amplitude: 2 V
Lead Channel Setting Pacing Amplitude: 2.5 V
Lead Channel Setting Pacing Pulse Width: 0.5 ms
Lead Channel Setting Sensing Sensitivity: 0.5 mV
Pulse Gen Model: 2272
Pulse Gen Serial Number: 8073661

## 2022-12-11 DIAGNOSIS — S52572D Other intraarticular fracture of lower end of left radius, subsequent encounter for closed fracture with routine healing: Secondary | ICD-10-CM | POA: Diagnosis not present

## 2022-12-29 NOTE — Progress Notes (Signed)
Remote pacemaker transmission.   

## 2023-01-05 DIAGNOSIS — E538 Deficiency of other specified B group vitamins: Secondary | ICD-10-CM | POA: Diagnosis not present

## 2023-01-05 DIAGNOSIS — S52572D Other intraarticular fracture of lower end of left radius, subsequent encounter for closed fracture with routine healing: Secondary | ICD-10-CM | POA: Diagnosis not present

## 2023-01-05 DIAGNOSIS — R739 Hyperglycemia, unspecified: Secondary | ICD-10-CM | POA: Diagnosis not present

## 2023-01-05 DIAGNOSIS — E782 Mixed hyperlipidemia: Secondary | ICD-10-CM | POA: Diagnosis not present

## 2023-01-13 DIAGNOSIS — J449 Chronic obstructive pulmonary disease, unspecified: Secondary | ICD-10-CM | POA: Diagnosis not present

## 2023-01-13 DIAGNOSIS — Z0000 Encounter for general adult medical examination without abnormal findings: Secondary | ICD-10-CM | POA: Diagnosis not present

## 2023-01-13 DIAGNOSIS — I2781 Cor pulmonale (chronic): Secondary | ICD-10-CM | POA: Diagnosis not present

## 2023-01-13 DIAGNOSIS — E782 Mixed hyperlipidemia: Secondary | ICD-10-CM | POA: Diagnosis not present

## 2023-02-16 ENCOUNTER — Ambulatory Visit (INDEPENDENT_AMBULATORY_CARE_PROVIDER_SITE_OTHER): Payer: PPO

## 2023-02-16 DIAGNOSIS — I495 Sick sinus syndrome: Secondary | ICD-10-CM | POA: Diagnosis not present

## 2023-02-16 LAB — CUP PACEART REMOTE DEVICE CHECK
Battery Remaining Longevity: 100 mo
Battery Remaining Percentage: 91 %
Battery Voltage: 3.02 V
Brady Statistic AP VP Percent: 1 %
Brady Statistic AP VS Percent: 91 %
Brady Statistic AS VP Percent: 1 %
Brady Statistic AS VS Percent: 9.4 %
Brady Statistic RA Percent Paced: 90 %
Brady Statistic RV Percent Paced: 1 %
Date Time Interrogation Session: 20240701020015
Implantable Lead Connection Status: 753985
Implantable Lead Connection Status: 753985
Implantable Lead Implant Date: 20230630
Implantable Lead Implant Date: 20230630
Implantable Lead Location: 753859
Implantable Lead Location: 753860
Implantable Pulse Generator Implant Date: 20230630
Lead Channel Impedance Value: 400 Ohm
Lead Channel Impedance Value: 490 Ohm
Lead Channel Pacing Threshold Amplitude: 0.5 V
Lead Channel Pacing Threshold Amplitude: 0.75 V
Lead Channel Pacing Threshold Pulse Width: 0.5 ms
Lead Channel Pacing Threshold Pulse Width: 0.5 ms
Lead Channel Sensing Intrinsic Amplitude: 3.4 mV
Lead Channel Sensing Intrinsic Amplitude: 3.4 mV
Lead Channel Setting Pacing Amplitude: 2 V
Lead Channel Setting Pacing Amplitude: 2.5 V
Lead Channel Setting Pacing Pulse Width: 0.5 ms
Lead Channel Setting Sensing Sensitivity: 0.5 mV
Pulse Gen Model: 2272
Pulse Gen Serial Number: 8073661

## 2023-03-12 NOTE — Progress Notes (Signed)
Remote pacemaker transmission.   

## 2023-04-29 DIAGNOSIS — Z950 Presence of cardiac pacemaker: Secondary | ICD-10-CM | POA: Diagnosis not present

## 2023-04-29 DIAGNOSIS — J449 Chronic obstructive pulmonary disease, unspecified: Secondary | ICD-10-CM | POA: Diagnosis not present

## 2023-04-29 DIAGNOSIS — Z86010 Personal history of colonic polyps: Secondary | ICD-10-CM | POA: Diagnosis not present

## 2023-04-29 DIAGNOSIS — I2781 Cor pulmonale (chronic): Secondary | ICD-10-CM | POA: Diagnosis not present

## 2023-04-29 DIAGNOSIS — K219 Gastro-esophageal reflux disease without esophagitis: Secondary | ICD-10-CM | POA: Diagnosis not present

## 2023-05-17 LAB — CUP PACEART REMOTE DEVICE CHECK
Battery Remaining Longevity: 96 mo
Battery Remaining Percentage: 89 %
Battery Voltage: 3.02 V
Brady Statistic AP VP Percent: 1 %
Brady Statistic AP VS Percent: 92 %
Brady Statistic AS VP Percent: 1 %
Brady Statistic AS VS Percent: 7.6 %
Brady Statistic RA Percent Paced: 92 %
Brady Statistic RV Percent Paced: 1 %
Date Time Interrogation Session: 20240930020023
Implantable Lead Connection Status: 753985
Implantable Lead Connection Status: 753985
Implantable Lead Implant Date: 20230630
Implantable Lead Implant Date: 20230630
Implantable Lead Location: 753859
Implantable Lead Location: 753860
Implantable Pulse Generator Implant Date: 20230630
Lead Channel Impedance Value: 380 Ohm
Lead Channel Impedance Value: 510 Ohm
Lead Channel Pacing Threshold Amplitude: 0.5 V
Lead Channel Pacing Threshold Amplitude: 0.75 V
Lead Channel Pacing Threshold Pulse Width: 0.5 ms
Lead Channel Pacing Threshold Pulse Width: 0.5 ms
Lead Channel Sensing Intrinsic Amplitude: 3.5 mV
Lead Channel Sensing Intrinsic Amplitude: 5.4 mV
Lead Channel Setting Pacing Amplitude: 2 V
Lead Channel Setting Pacing Amplitude: 2.5 V
Lead Channel Setting Pacing Pulse Width: 0.5 ms
Lead Channel Setting Sensing Sensitivity: 0.5 mV
Pulse Gen Model: 2272
Pulse Gen Serial Number: 8073661

## 2023-05-18 ENCOUNTER — Ambulatory Visit (INDEPENDENT_AMBULATORY_CARE_PROVIDER_SITE_OTHER): Payer: PPO

## 2023-05-18 DIAGNOSIS — I495 Sick sinus syndrome: Secondary | ICD-10-CM

## 2023-06-02 NOTE — Progress Notes (Signed)
Remote pacemaker transmission.   

## 2023-06-20 DIAGNOSIS — J209 Acute bronchitis, unspecified: Secondary | ICD-10-CM | POA: Diagnosis not present

## 2023-06-20 DIAGNOSIS — B9689 Other specified bacterial agents as the cause of diseases classified elsewhere: Secondary | ICD-10-CM | POA: Diagnosis not present

## 2023-06-20 DIAGNOSIS — J019 Acute sinusitis, unspecified: Secondary | ICD-10-CM | POA: Diagnosis not present

## 2023-06-20 DIAGNOSIS — Z03818 Encounter for observation for suspected exposure to other biological agents ruled out: Secondary | ICD-10-CM | POA: Diagnosis not present

## 2023-07-23 DIAGNOSIS — E782 Mixed hyperlipidemia: Secondary | ICD-10-CM | POA: Diagnosis not present

## 2023-07-27 DIAGNOSIS — E782 Mixed hyperlipidemia: Secondary | ICD-10-CM | POA: Diagnosis not present

## 2023-07-27 DIAGNOSIS — R739 Hyperglycemia, unspecified: Secondary | ICD-10-CM | POA: Diagnosis not present

## 2023-07-27 DIAGNOSIS — E538 Deficiency of other specified B group vitamins: Secondary | ICD-10-CM | POA: Diagnosis not present

## 2023-07-27 DIAGNOSIS — I2781 Cor pulmonale (chronic): Secondary | ICD-10-CM | POA: Diagnosis not present

## 2023-07-29 ENCOUNTER — Telehealth: Payer: Self-pay | Admitting: Cardiology

## 2023-07-29 NOTE — Telephone Encounter (Signed)
  1. Has your device fired? no  2. Is you device beeping? no  3. Are you experiencing draining or swelling at device site? no  4. Are you calling to see if we received your device transmission? no  5. Have you passed out? Patient wants to know does she need the W FI in order for her device to work from home? Please route to Device Clinic Pool

## 2023-07-29 NOTE — Telephone Encounter (Signed)
Attempted outreach to Pt x 2 at number provided.  Each time got a message "your number cannot be completed as dialed".  Will try later.

## 2023-07-30 NOTE — Telephone Encounter (Signed)
Call back received from Pt.  She has a bedside monitor.  Advised her bedside monitor has everything it needs internally to connect to cell towers.    Pt indicates understanding.

## 2023-08-17 ENCOUNTER — Ambulatory Visit (INDEPENDENT_AMBULATORY_CARE_PROVIDER_SITE_OTHER): Payer: PPO

## 2023-08-17 DIAGNOSIS — I495 Sick sinus syndrome: Secondary | ICD-10-CM | POA: Diagnosis not present

## 2023-08-17 LAB — CUP PACEART REMOTE DEVICE CHECK
Battery Remaining Longevity: 95 mo
Battery Remaining Percentage: 87 %
Battery Voltage: 3.02 V
Brady Statistic AP VP Percent: 1 %
Brady Statistic AP VS Percent: 93 %
Brady Statistic AS VP Percent: 1 %
Brady Statistic AS VS Percent: 7 %
Brady Statistic RA Percent Paced: 93 %
Brady Statistic RV Percent Paced: 1 %
Date Time Interrogation Session: 20241231020014
Implantable Lead Connection Status: 753985
Implantable Lead Connection Status: 753985
Implantable Lead Implant Date: 20230630
Implantable Lead Implant Date: 20230630
Implantable Lead Location: 753859
Implantable Lead Location: 753860
Implantable Pulse Generator Implant Date: 20230630
Lead Channel Impedance Value: 400 Ohm
Lead Channel Impedance Value: 490 Ohm
Lead Channel Pacing Threshold Amplitude: 0.5 V
Lead Channel Pacing Threshold Amplitude: 0.75 V
Lead Channel Pacing Threshold Pulse Width: 0.5 ms
Lead Channel Pacing Threshold Pulse Width: 0.5 ms
Lead Channel Sensing Intrinsic Amplitude: 3.3 mV
Lead Channel Sensing Intrinsic Amplitude: 7.8 mV
Lead Channel Setting Pacing Amplitude: 2 V
Lead Channel Setting Pacing Amplitude: 2.5 V
Lead Channel Setting Pacing Pulse Width: 0.5 ms
Lead Channel Setting Sensing Sensitivity: 0.5 mV
Pulse Gen Model: 2272
Pulse Gen Serial Number: 8073661

## 2023-09-28 NOTE — Progress Notes (Signed)
Remote pacemaker transmission.

## 2023-10-14 DIAGNOSIS — I2609 Other pulmonary embolism with acute cor pulmonale: Secondary | ICD-10-CM | POA: Diagnosis not present

## 2023-10-14 DIAGNOSIS — L03116 Cellulitis of left lower limb: Secondary | ICD-10-CM | POA: Diagnosis not present

## 2023-10-14 DIAGNOSIS — J449 Chronic obstructive pulmonary disease, unspecified: Secondary | ICD-10-CM | POA: Diagnosis not present

## 2023-10-19 DIAGNOSIS — I2609 Other pulmonary embolism with acute cor pulmonale: Secondary | ICD-10-CM | POA: Diagnosis not present

## 2023-10-19 DIAGNOSIS — L03116 Cellulitis of left lower limb: Secondary | ICD-10-CM | POA: Diagnosis not present

## 2023-10-26 DIAGNOSIS — L03116 Cellulitis of left lower limb: Secondary | ICD-10-CM | POA: Diagnosis not present

## 2023-10-26 DIAGNOSIS — S81801D Unspecified open wound, right lower leg, subsequent encounter: Secondary | ICD-10-CM | POA: Diagnosis not present

## 2023-10-26 DIAGNOSIS — R600 Localized edema: Secondary | ICD-10-CM | POA: Diagnosis not present

## 2023-11-02 DIAGNOSIS — L03116 Cellulitis of left lower limb: Secondary | ICD-10-CM | POA: Diagnosis not present

## 2023-11-02 DIAGNOSIS — R600 Localized edema: Secondary | ICD-10-CM | POA: Diagnosis not present

## 2023-11-12 DIAGNOSIS — Z961 Presence of intraocular lens: Secondary | ICD-10-CM | POA: Diagnosis not present

## 2023-11-15 ENCOUNTER — Telehealth: Payer: Self-pay | Admitting: Cardiology

## 2023-11-15 NOTE — Telephone Encounter (Signed)
Called patient. No answer and unable to leave message

## 2023-11-15 NOTE — Telephone Encounter (Signed)
 Pt c/o of Chest Pain: STAT if active (IN THIS MOMENT) CP, including tightness, pressure, jaw pain, shoulder/upper arm/back pain, SOB, nausea, and vomiting.  1. Are you having CP right now (tightness, pressure, or discomfort)? No   2. Are you experiencing any other symptoms (ex. SOB, nausea, vomiting, sweating)?  No   3. How long have you been experiencing CP?  Began a few months ago, but   4. Is your CP continuous or coming and going?  Comes and goes when bending over, but becoming more frequent  5. Have you taken Nitroglycerin?  No, patient says she doesn't take nitroglycerin

## 2023-11-16 ENCOUNTER — Ambulatory Visit (INDEPENDENT_AMBULATORY_CARE_PROVIDER_SITE_OTHER): Payer: PPO

## 2023-11-16 DIAGNOSIS — I495 Sick sinus syndrome: Secondary | ICD-10-CM | POA: Diagnosis not present

## 2023-11-16 LAB — CUP PACEART REMOTE DEVICE CHECK
Battery Remaining Longevity: 93 mo
Battery Remaining Percentage: 84 %
Battery Voltage: 3.01 V
Brady Statistic AP VP Percent: 1 %
Brady Statistic AP VS Percent: 93 %
Brady Statistic AS VP Percent: 1 %
Brady Statistic AS VS Percent: 6.7 %
Brady Statistic RA Percent Paced: 93 %
Brady Statistic RV Percent Paced: 1 %
Date Time Interrogation Session: 20250401020015
Implantable Lead Connection Status: 753985
Implantable Lead Connection Status: 753985
Implantable Lead Implant Date: 20230630
Implantable Lead Implant Date: 20230630
Implantable Lead Location: 753859
Implantable Lead Location: 753860
Implantable Pulse Generator Implant Date: 20230630
Lead Channel Impedance Value: 430 Ohm
Lead Channel Impedance Value: 490 Ohm
Lead Channel Pacing Threshold Amplitude: 0.5 V
Lead Channel Pacing Threshold Amplitude: 0.75 V
Lead Channel Pacing Threshold Pulse Width: 0.5 ms
Lead Channel Pacing Threshold Pulse Width: 0.5 ms
Lead Channel Sensing Intrinsic Amplitude: 3.1 mV
Lead Channel Sensing Intrinsic Amplitude: 5.1 mV
Lead Channel Setting Pacing Amplitude: 2 V
Lead Channel Setting Pacing Amplitude: 2.5 V
Lead Channel Setting Pacing Pulse Width: 0.5 ms
Lead Channel Setting Sensing Sensitivity: 0.5 mV
Pulse Gen Model: 2272
Pulse Gen Serial Number: 8073661

## 2023-11-17 NOTE — Telephone Encounter (Signed)
 Attempted to contact patient. Phone number not in service.

## 2023-11-20 ENCOUNTER — Encounter: Payer: Self-pay | Admitting: Cardiology

## 2023-11-22 NOTE — Telephone Encounter (Signed)
 Attempted to contact patient to see if she was still having chest pains. Unable to reach patient, or leave message.   After third attempt will remove from triage.

## 2023-11-23 NOTE — Progress Notes (Unsigned)
  Electrophysiology Office Follow up Visit Note:    Date:  11/24/2023   ID:  Renee Meyer, DOB 05-Jul-1941, MRN [id number]  PCP:  Danella Penton, MD  [facility] Cardiologist:  Yvonne Kendall, MD  [facility] Electrophysiologist:  Lanier Prude, MD    Interval History:     Renee Meyer is a 83 y.o. female who presents for a follow up visit.   I last saw the patient May 27, 2022.  She has a permanent pacemaker in place that was originally implanted February 13, 2022.  Her medical history also includes hypertension.  Remote monitoring has shown stable device function since she was last seen.  She is with family today in clinic. Reports episodes of sharp chest discomfort in her right breast when she bends over. She has been coughing more recently due to the pollen. No pain in the breast when she is up and moving. Incision is OK, no changes to the skin over the device.        Past medical, surgical, social and family history were reviewed.  ROS:   Please see the history of present illness.    All other systems reviewed and are negative.  EKGs/Labs/Other Studies Reviewed:    The following studies were reviewed today:  November 24, 2023 in-clinic device interrogation personally reviewed Battery and lead parameters stable. No programming changes today.          Physical Exam:    VS:  BP 108/60   Pulse 63   Ht 5\' 4"  (1.626 m)   Wt 202 lb 3.2 oz (91.7 kg)   SpO2 96%   BMI 34.71 kg/m     Wt Readings from Last 3 Encounters:  11/24/23 202 lb 3.2 oz (91.7 kg)  05/27/22 225 lb (102.1 kg)  02/14/22 211 lb 3.2 oz (95.8 kg)     GEN: no distress. Obese. CARD: RRR, No MRG.  CIED pocket well-healed RESP: No IWOB. CTAB.      ASSESSMENT:    1. Sinus node dysfunction (HCC)   2. Cardiac pacemaker in situ   3. Essential hypertension    PLAN:    In order of problems listed above:  #Sharp chest discomfort Episodes are positional and sharp and last seconds at  a time. Not related to exertion. I believe these episodes are most likely MSK related, possibly a muscle pull from recent increased coughing.  #Sinus node dysfunction #Permanent pacemaker in situ Device functioning appropriately.  Continue remote monitoring.  #Hypertension At goal today.  Recommend checking blood pressures 1-2 times per week at home and recording the values.  Recommend bringing these recordings to the primary care physician. Continue amlodipine, Lasix, metoprolol  Follow-up 1 year with EP APP   Signed, Steffanie Dunn, MD, [facility], [facility] 11/24/2023 9:42 AM    Electrophysiology [facility]

## 2023-11-24 ENCOUNTER — Ambulatory Visit: Attending: Cardiology | Admitting: Cardiology

## 2023-11-24 ENCOUNTER — Encounter: Payer: Self-pay | Admitting: Cardiology

## 2023-11-24 VITALS — BP 108/60 | HR 63 | Ht 64.0 in | Wt 202.2 lb

## 2023-11-24 DIAGNOSIS — I10 Essential (primary) hypertension: Secondary | ICD-10-CM

## 2023-11-24 DIAGNOSIS — Z950 Presence of cardiac pacemaker: Secondary | ICD-10-CM

## 2023-11-24 DIAGNOSIS — I495 Sick sinus syndrome: Secondary | ICD-10-CM | POA: Diagnosis not present

## 2023-11-24 LAB — CUP PACEART INCLINIC DEVICE CHECK
Battery Remaining Longevity: 98 mo
Battery Voltage: 3.01 V
Brady Statistic RA Percent Paced: 93 %
Brady Statistic RV Percent Paced: 0.06 %
Date Time Interrogation Session: 20250409111003
Implantable Lead Connection Status: 753985
Implantable Lead Connection Status: 753985
Implantable Lead Implant Date: 20230630
Implantable Lead Implant Date: 20230630
Implantable Lead Location: 753859
Implantable Lead Location: 753860
Implantable Pulse Generator Implant Date: 20230630
Lead Channel Impedance Value: 425 Ohm
Lead Channel Impedance Value: 525 Ohm
Lead Channel Pacing Threshold Amplitude: 0.75 V
Lead Channel Pacing Threshold Amplitude: 0.75 V
Lead Channel Pacing Threshold Amplitude: 0.75 V
Lead Channel Pacing Threshold Amplitude: 0.75 V
Lead Channel Pacing Threshold Pulse Width: 0.5 ms
Lead Channel Pacing Threshold Pulse Width: 0.5 ms
Lead Channel Pacing Threshold Pulse Width: 0.5 ms
Lead Channel Pacing Threshold Pulse Width: 0.5 ms
Lead Channel Sensing Intrinsic Amplitude: 3.7 mV
Lead Channel Sensing Intrinsic Amplitude: 5.8 mV
Lead Channel Setting Pacing Amplitude: 2 V
Lead Channel Setting Pacing Amplitude: 2.5 V
Lead Channel Setting Pacing Pulse Width: 0.5 ms
Lead Channel Setting Sensing Sensitivity: 0.5 mV
Pulse Gen Model: 2272
Pulse Gen Serial Number: 8073661

## 2023-11-24 NOTE — Patient Instructions (Signed)

## 2023-11-27 ENCOUNTER — Encounter: Payer: Self-pay | Admitting: Cardiology

## 2023-12-17 IMAGING — MR MR HEAD WO/W CM
14 series · 48 of 48 positions shown · IV contrast (gadavist)
Comparison: CT head December 01, 2021.

CLINICAL DATA: Dizziness.

EXAM:
MRI HEAD WITHOUT AND WITH CONTRAST
TECHNIQUE: Multiplanar, multiecho pulse sequences of the brain and surrounding
structures were obtained without and with intravenous contrast.
CONTRAST:  10mL GADAVIST GADOBUTROL 1 MMOL/ML IV SOLN

[Series 5: ax dwi_tracew · axial · 3.0mm · 0.65mm/px · z∈[-82,+72]mm · 3 of 48 slices shown]
[im 1/48]
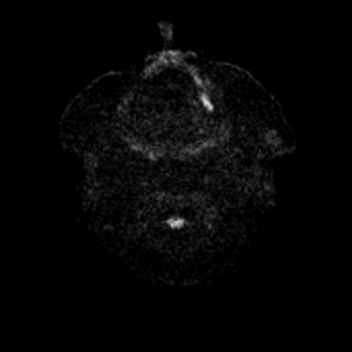
[im 24/48]
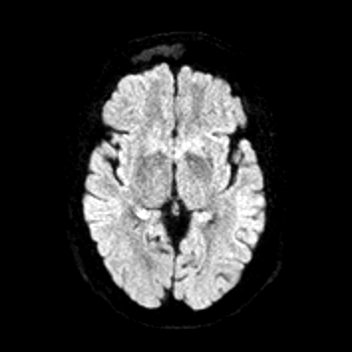
[im 48/48]
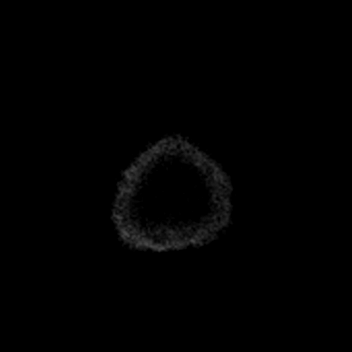

[Series 6: ax dwi_adc · axial · 3.0mm · 0.65mm/px · z∈[-82,+72]mm · 2 of 48 slices shown]
[im 1/48]
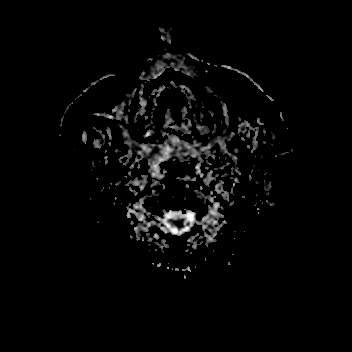
[im 48/48]
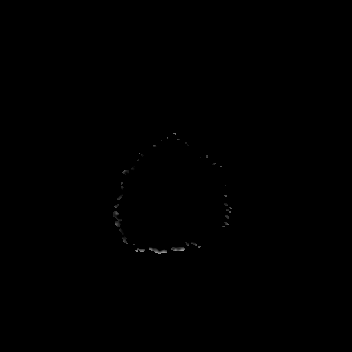

[Series 7: cor dwi_tracew · coronal · 5.0mm · 0.68mm/px · 2 of 40 slices shown]
[im 1/40]
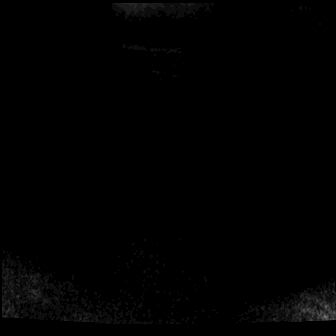
[im 40/40]
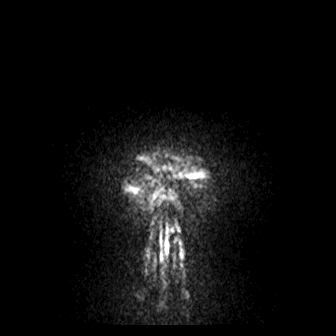

[Series 8: cor dwi_adc · coronal · 5.0mm · 0.68mm/px · 2 of 38 slices shown]
[im 1/38]
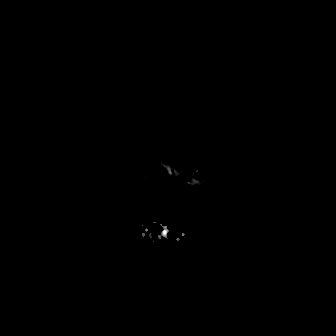
[im 38/38]
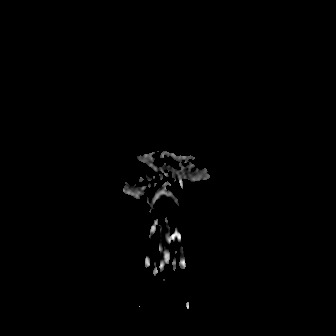

[Series 9: T1 · sagittal · 5.0mm · 0.62mm/px · 1 of 23 slices shown (1 of 2)]
[im 1/23]
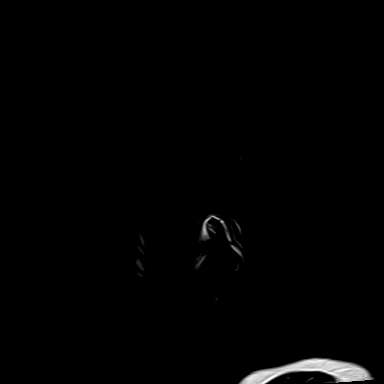

[Series 10: T2 · axial · 5.0mm · 0.53mm/px · 1 of 25 slices shown]
[im 1/25]
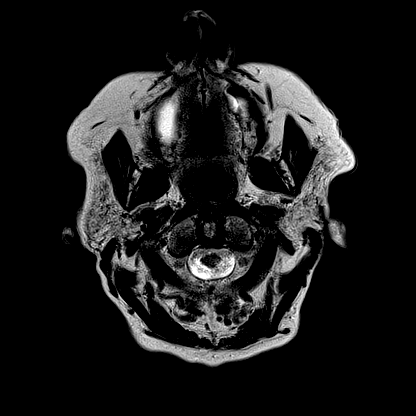

[Series 11: ax swi_mag · axial · 2.0mm · 0.90mm/px · z∈[-82,+74]mm · 4 of 80 slices shown]
[im 1/80]
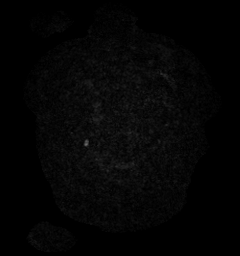
[im 27/80]
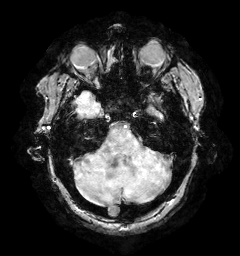
[im 53/80]
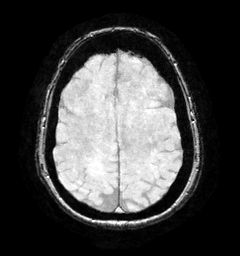
[im 80/80]
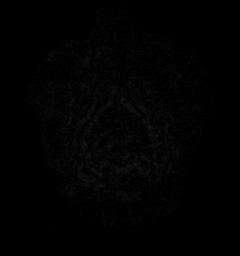

[Series 12: ax swi_pha · axial · 2.0mm · 0.90mm/px · z∈[-82,+74]mm · 4 of 80 slices shown]
[im 1/80]
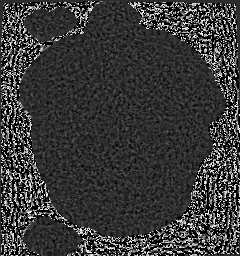
[im 27/80]
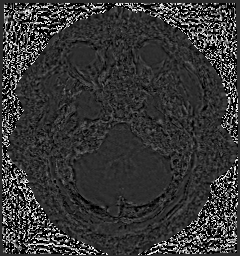
[im 53/80]
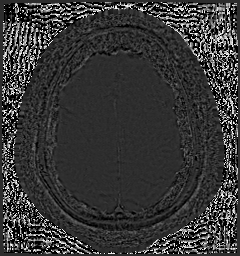
[im 80/80]
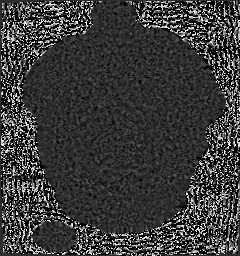

[Series 13: ax swi_swi · axial · 2.0mm · 0.90mm/px · z∈[-82,+74]mm · 4 of 80 slices shown]
[im 1/80]
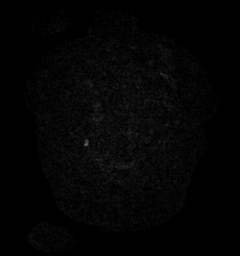
[im 27/80]
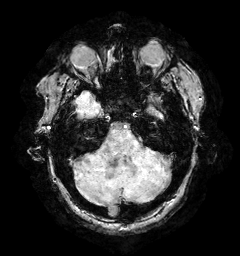
[im 53/80]
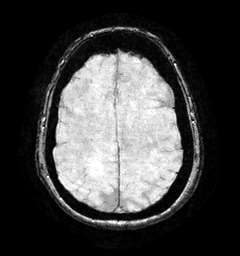
[im 80/80]
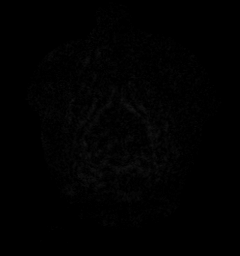

[Series 15: FLAIR · axial · 3.0mm · 0.53mm/px · z∈[-83,+78]mm · 3 of 55 slices shown]
[im 1/55]
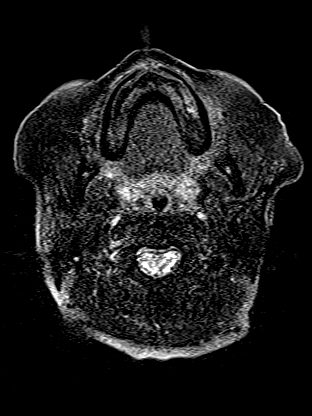
[im 28/55]
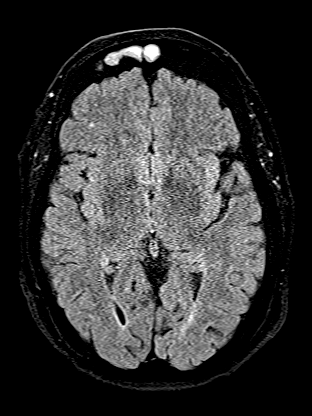
[im 55/55]
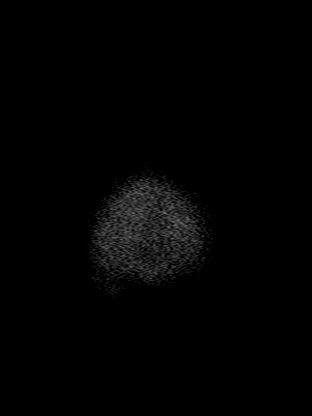

[Series 16: T1 · axial · 1.0mm · 0.98mm/px · z∈[-88,+85]mm · 9 of 175 slices shown (2 of 2)]
[im 1/175]
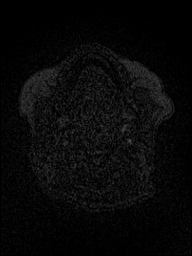
[im 22/175]
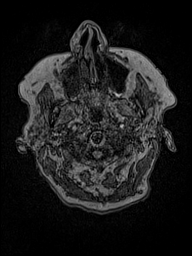
[im 44/175]
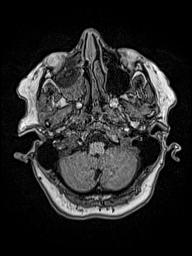
[im 66/175]
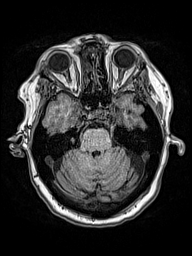
[im 88/175]
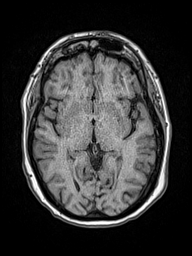
[im 109/175]
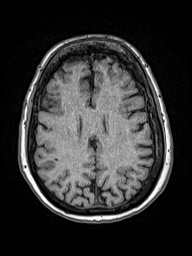
[im 131/175]
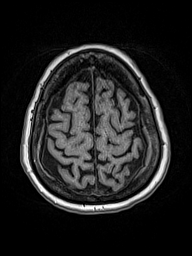
[im 153/175]
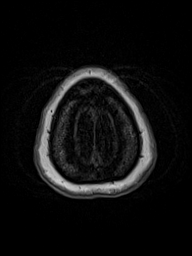
[im 175/175]
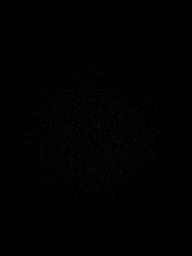

[Series 17: T2 post-contrast · coronal · 5.0mm · 0.57mm/px · 2 of 29 slices shown]
[im 1/29]
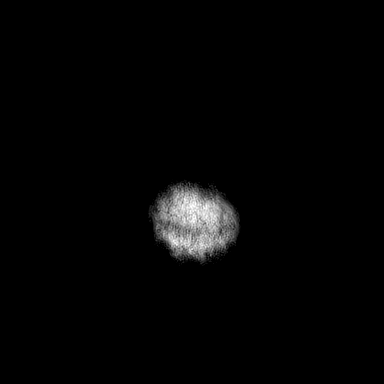
[im 29/29]
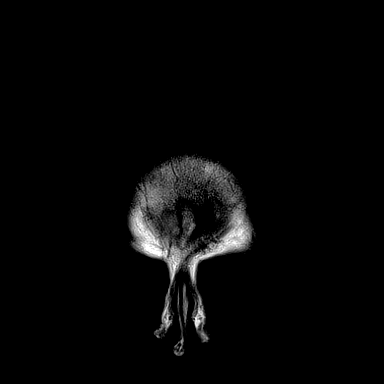

[Series 18: T1 post-contrast · axial · 1.0mm · 0.98mm/px · z∈[-87,+85]mm · 9 of 175 slices shown (1 of 2)]
[im 1/175]
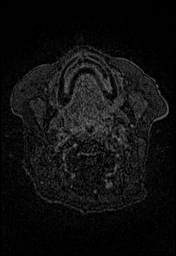
[im 22/175]
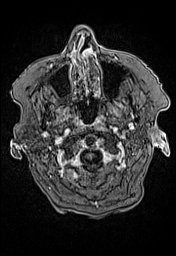
[im 44/175]
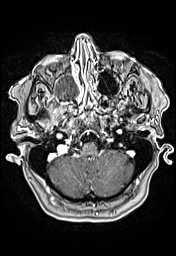
[im 66/175]
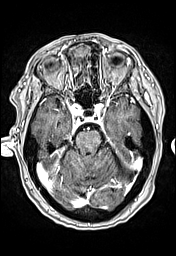
[im 88/175]
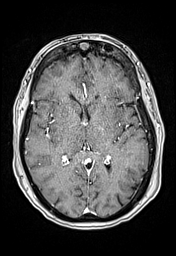
[im 109/175]
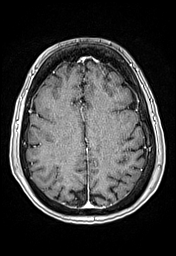
[im 131/175]
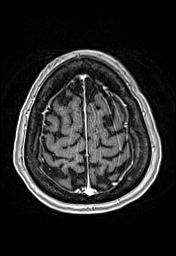
[im 153/175]
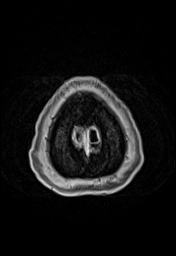
[im 175/175]
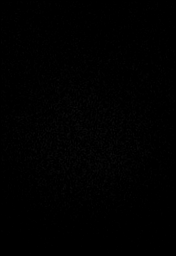

[Series 19: T1 post-contrast · coronal · 5.0mm · 0.57mm/px · 2 of 29 slices shown (2 of 2)]
[im 1/29]
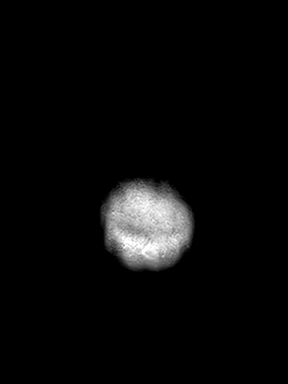
[im 29/29]
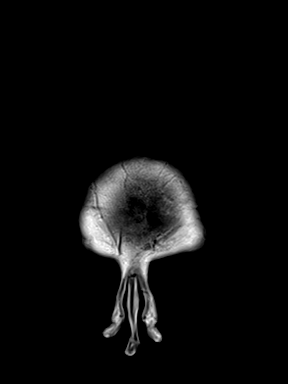

[48 of 48 positions shown; findings below may reference images not displayed]

FINDINGS: Brain: No acute infarction, hemorrhage, hydrocephalus, extra-axial
collection or mass lesion. Mild scattered T2/FLAIR hyperintensities
in the white matter, nonspecific but compatible with chronic
microvascular ischemic disease. No pathologic enhancement.

Vascular: Major arterial flow voids are maintained at the skull
base.

Skull and upper cervical spine: Normal marrow signal.

Sinuses/Orbits: Complete opacification of the right frontal sinus,
anterior right ethmoid air cells and right maxillary sinus with
associated mucosal thickening. No acute orbital findings.

Other: No mastoid effusions.
IMPRESSION: 1. No evidence of acute intracranial abnormality.
2. Right ostiomeatal unit sinusitis.

## 2023-12-30 NOTE — Progress Notes (Signed)
 Remote pacemaker transmission.

## 2024-01-25 DIAGNOSIS — R739 Hyperglycemia, unspecified: Secondary | ICD-10-CM | POA: Diagnosis not present

## 2024-01-25 DIAGNOSIS — E782 Mixed hyperlipidemia: Secondary | ICD-10-CM | POA: Diagnosis not present

## 2024-01-25 DIAGNOSIS — E538 Deficiency of other specified B group vitamins: Secondary | ICD-10-CM | POA: Diagnosis not present

## 2024-02-01 DIAGNOSIS — E782 Mixed hyperlipidemia: Secondary | ICD-10-CM | POA: Diagnosis not present

## 2024-02-01 DIAGNOSIS — Z0000 Encounter for general adult medical examination without abnormal findings: Secondary | ICD-10-CM | POA: Diagnosis not present

## 2024-02-01 DIAGNOSIS — J449 Chronic obstructive pulmonary disease, unspecified: Secondary | ICD-10-CM | POA: Diagnosis not present

## 2024-02-01 DIAGNOSIS — I2781 Cor pulmonale (chronic): Secondary | ICD-10-CM | POA: Diagnosis not present

## 2024-02-01 DIAGNOSIS — E538 Deficiency of other specified B group vitamins: Secondary | ICD-10-CM | POA: Diagnosis not present

## 2024-02-01 DIAGNOSIS — N1831 Chronic kidney disease, stage 3a: Secondary | ICD-10-CM | POA: Diagnosis not present

## 2024-02-15 ENCOUNTER — Ambulatory Visit: Payer: PPO

## 2024-02-15 DIAGNOSIS — I495 Sick sinus syndrome: Secondary | ICD-10-CM | POA: Diagnosis not present

## 2024-02-15 LAB — CUP PACEART REMOTE DEVICE CHECK
Battery Remaining Longevity: 90 mo
Battery Remaining Percentage: 82 %
Battery Voltage: 3.01 V
Brady Statistic AP VP Percent: 1 %
Brady Statistic AP VS Percent: 99 %
Brady Statistic AS VP Percent: 1 %
Brady Statistic AS VS Percent: 1 %
Brady Statistic RA Percent Paced: 99 %
Brady Statistic RV Percent Paced: 1 %
Date Time Interrogation Session: 20250701020014
Implantable Lead Connection Status: 753985
Implantable Lead Connection Status: 753985
Implantable Lead Implant Date: 20230630
Implantable Lead Implant Date: 20230630
Implantable Lead Location: 753859
Implantable Lead Location: 753860
Implantable Pulse Generator Implant Date: 20230630
Lead Channel Impedance Value: 430 Ohm
Lead Channel Impedance Value: 510 Ohm
Lead Channel Pacing Threshold Amplitude: 0.75 V
Lead Channel Pacing Threshold Amplitude: 0.75 V
Lead Channel Pacing Threshold Pulse Width: 0.5 ms
Lead Channel Pacing Threshold Pulse Width: 0.5 ms
Lead Channel Sensing Intrinsic Amplitude: 3.1 mV
Lead Channel Sensing Intrinsic Amplitude: 3.8 mV
Lead Channel Setting Pacing Amplitude: 2 V
Lead Channel Setting Pacing Amplitude: 2.5 V
Lead Channel Setting Pacing Pulse Width: 0.5 ms
Lead Channel Setting Sensing Sensitivity: 0.5 mV
Pulse Gen Model: 2272
Pulse Gen Serial Number: 8073661

## 2024-02-16 ENCOUNTER — Ambulatory Visit: Payer: Self-pay | Admitting: Cardiology

## 2024-05-16 ENCOUNTER — Ambulatory Visit (INDEPENDENT_AMBULATORY_CARE_PROVIDER_SITE_OTHER): Payer: PPO

## 2024-05-16 DIAGNOSIS — I495 Sick sinus syndrome: Secondary | ICD-10-CM | POA: Diagnosis not present

## 2024-05-18 ENCOUNTER — Ambulatory Visit: Payer: Self-pay | Admitting: Cardiology

## 2024-05-18 LAB — CUP PACEART REMOTE DEVICE CHECK
Battery Remaining Longevity: 88 mo
Battery Remaining Percentage: 79 %
Battery Voltage: 3.01 V
Brady Statistic AP VP Percent: 1 %
Brady Statistic AP VS Percent: 99 %
Brady Statistic AS VP Percent: 1 %
Brady Statistic AS VS Percent: 1 %
Brady Statistic RA Percent Paced: 99 %
Brady Statistic RV Percent Paced: 1 %
Date Time Interrogation Session: 20250930020036
Implantable Lead Connection Status: 753985
Implantable Lead Connection Status: 753985
Implantable Lead Implant Date: 20230630
Implantable Lead Implant Date: 20230630
Implantable Lead Location: 753859
Implantable Lead Location: 753860
Implantable Pulse Generator Implant Date: 20230630
Lead Channel Impedance Value: 430 Ohm
Lead Channel Impedance Value: 530 Ohm
Lead Channel Pacing Threshold Amplitude: 0.75 V
Lead Channel Pacing Threshold Amplitude: 0.75 V
Lead Channel Pacing Threshold Pulse Width: 0.5 ms
Lead Channel Pacing Threshold Pulse Width: 0.5 ms
Lead Channel Sensing Intrinsic Amplitude: 3.1 mV
Lead Channel Sensing Intrinsic Amplitude: 4.3 mV
Lead Channel Setting Pacing Amplitude: 2 V
Lead Channel Setting Pacing Amplitude: 2.5 V
Lead Channel Setting Pacing Pulse Width: 0.5 ms
Lead Channel Setting Sensing Sensitivity: 0.5 mV
Pulse Gen Model: 2272
Pulse Gen Serial Number: 8073661

## 2024-05-18 NOTE — Progress Notes (Signed)
 Remote PPM Transmission

## 2024-05-30 DIAGNOSIS — E538 Deficiency of other specified B group vitamins: Secondary | ICD-10-CM | POA: Diagnosis not present

## 2024-05-30 DIAGNOSIS — L237 Allergic contact dermatitis due to plants, except food: Secondary | ICD-10-CM | POA: Diagnosis not present

## 2024-08-15 ENCOUNTER — Ambulatory Visit (INDEPENDENT_AMBULATORY_CARE_PROVIDER_SITE_OTHER): Payer: PPO

## 2024-08-15 DIAGNOSIS — I495 Sick sinus syndrome: Secondary | ICD-10-CM

## 2024-08-15 LAB — CUP PACEART REMOTE DEVICE CHECK
Battery Remaining Longevity: 86 mo
Battery Remaining Percentage: 77 %
Battery Voltage: 3.01 V
Brady Statistic AP VP Percent: 1 %
Brady Statistic AP VS Percent: 99 %
Brady Statistic AS VP Percent: 1 %
Brady Statistic AS VS Percent: 1 %
Brady Statistic RA Percent Paced: 99 %
Brady Statistic RV Percent Paced: 1 %
Date Time Interrogation Session: 20251230020021
Implantable Lead Connection Status: 753985
Implantable Lead Connection Status: 753985
Implantable Lead Implant Date: 20230630
Implantable Lead Implant Date: 20230630
Implantable Lead Location: 753859
Implantable Lead Location: 753860
Implantable Pulse Generator Implant Date: 20230630
Lead Channel Impedance Value: 450 Ohm
Lead Channel Impedance Value: 530 Ohm
Lead Channel Pacing Threshold Amplitude: 0.75 V
Lead Channel Pacing Threshold Amplitude: 0.75 V
Lead Channel Pacing Threshold Pulse Width: 0.5 ms
Lead Channel Pacing Threshold Pulse Width: 0.5 ms
Lead Channel Sensing Intrinsic Amplitude: 3.4 mV
Lead Channel Sensing Intrinsic Amplitude: 3.7 mV
Lead Channel Setting Pacing Amplitude: 2 V
Lead Channel Setting Pacing Amplitude: 2.5 V
Lead Channel Setting Pacing Pulse Width: 0.5 ms
Lead Channel Setting Sensing Sensitivity: 0.5 mV
Pulse Gen Model: 2272
Pulse Gen Serial Number: 8073661

## 2024-08-20 ENCOUNTER — Ambulatory Visit: Payer: Self-pay | Admitting: Cardiology

## 2024-08-23 NOTE — Progress Notes (Signed)
 Remote PPM Transmission

## 2024-11-07 ENCOUNTER — Ambulatory Visit: Admitting: Cardiology
